# Patient Record
Sex: Male | Born: 1985 | Race: White | Hispanic: No | Marital: Married | Smoking: Former smoker
Health system: Southern US, Community
[De-identification: ages and names within clinical notes are randomized; demographics above are authoritative.]

## PROBLEM LIST (undated history)

## (undated) DIAGNOSIS — S2239XA Fracture of one rib, unspecified side, initial encounter for closed fracture: Secondary | ICD-10-CM

## (undated) DIAGNOSIS — S22000A Wedge compression fracture of unspecified thoracic vertebra, initial encounter for closed fracture: Secondary | ICD-10-CM

## (undated) DIAGNOSIS — W14XXXA Fall from tree, initial encounter: Secondary | ICD-10-CM

## (undated) DIAGNOSIS — S7292XA Unspecified fracture of left femur, initial encounter for closed fracture: Secondary | ICD-10-CM

## (undated) DIAGNOSIS — I10 Essential (primary) hypertension: Secondary | ICD-10-CM

## (undated) HISTORY — PX: INGUINAL HERNIA REPAIR: SUR1180

## (undated) HISTORY — PX: TYMPANOSTOMY TUBE PLACEMENT: SHX32

## (undated) HISTORY — PX: HERNIA REPAIR: SHX51

## (undated) HISTORY — PX: FRACTURE SURGERY: SHX138

---

## 2007-01-08 DIAGNOSIS — S22000A Wedge compression fracture of unspecified thoracic vertebra, initial encounter for closed fracture: Secondary | ICD-10-CM

## 2007-01-08 DIAGNOSIS — S2249XA Multiple fractures of ribs, unspecified side, initial encounter for closed fracture: Secondary | ICD-10-CM

## 2007-01-08 HISTORY — DX: Wedge compression fracture of unspecified thoracic vertebra, initial encounter for closed fracture: S22.000A

## 2007-01-08 HISTORY — DX: Multiple fractures of ribs, unspecified side, initial encounter for closed fracture: S22.49XA

## 2017-02-27 DIAGNOSIS — R030 Elevated blood-pressure reading, without diagnosis of hypertension: Secondary | ICD-10-CM | POA: Diagnosis not present

## 2017-02-27 DIAGNOSIS — L84 Corns and callosities: Secondary | ICD-10-CM | POA: Diagnosis not present

## 2017-09-12 ENCOUNTER — Emergency Department (HOSPITAL_COMMUNITY): Payer: Medicaid Other

## 2017-09-12 ENCOUNTER — Encounter (HOSPITAL_COMMUNITY): Payer: Self-pay | Admitting: Emergency Medicine

## 2017-09-12 ENCOUNTER — Other Ambulatory Visit: Payer: Self-pay

## 2017-09-12 ENCOUNTER — Encounter (HOSPITAL_COMMUNITY)
Admission: EM | Disposition: A | Discharge status: Home / Self Care | Payer: Self-pay | Source: Home / Self Care | Attending: Vascular Surgery

## 2017-09-12 ENCOUNTER — Inpatient Hospital Stay (HOSPITAL_COMMUNITY)
Admission: EM | Admit: 2017-09-12 | Discharge: 2017-09-15 | DRG: 907 | Disposition: A | Discharge status: Home / Self Care | Payer: Medicaid Other | Attending: Vascular Surgery | Admitting: Vascular Surgery

## 2017-09-12 ENCOUNTER — Emergency Department (HOSPITAL_COMMUNITY): Payer: Medicaid Other | Admitting: Certified Registered Nurse Anesthetist

## 2017-09-12 ENCOUNTER — Inpatient Hospital Stay (HOSPITAL_COMMUNITY): Payer: Medicaid Other

## 2017-09-12 DIAGNOSIS — Y9289 Other specified places as the place of occurrence of the external cause: Secondary | ICD-10-CM

## 2017-09-12 DIAGNOSIS — F17290 Nicotine dependence, other tobacco product, uncomplicated: Secondary | ICD-10-CM | POA: Diagnosis present

## 2017-09-12 DIAGNOSIS — S728X2A Other fracture of left femur, initial encounter for closed fracture: Secondary | ICD-10-CM | POA: Diagnosis not present

## 2017-09-12 DIAGNOSIS — S72351A Displaced comminuted fracture of shaft of right femur, initial encounter for closed fracture: Secondary | ICD-10-CM | POA: Diagnosis not present

## 2017-09-12 DIAGNOSIS — S3991XA Unspecified injury of abdomen, initial encounter: Secondary | ICD-10-CM | POA: Diagnosis not present

## 2017-09-12 DIAGNOSIS — S199XXA Unspecified injury of neck, initial encounter: Secondary | ICD-10-CM | POA: Diagnosis not present

## 2017-09-12 DIAGNOSIS — I998 Other disorder of circulatory system: Secondary | ICD-10-CM | POA: Diagnosis present

## 2017-09-12 DIAGNOSIS — S72352A Displaced comminuted fracture of shaft of left femur, initial encounter for closed fracture: Secondary | ICD-10-CM | POA: Diagnosis present

## 2017-09-12 DIAGNOSIS — Y93H2 Activity, gardening and landscaping: Secondary | ICD-10-CM | POA: Diagnosis not present

## 2017-09-12 DIAGNOSIS — S7292XB Unspecified fracture of left femur, initial encounter for open fracture type I or II: Secondary | ICD-10-CM | POA: Diagnosis not present

## 2017-09-12 DIAGNOSIS — R52 Pain, unspecified: Secondary | ICD-10-CM

## 2017-09-12 DIAGNOSIS — Z888 Allergy status to other drugs, medicaments and biological substances status: Secondary | ICD-10-CM | POA: Diagnosis not present

## 2017-09-12 DIAGNOSIS — W14XXXA Fall from tree, initial encounter: Secondary | ICD-10-CM | POA: Diagnosis not present

## 2017-09-12 DIAGNOSIS — R102 Pelvic and perineal pain: Secondary | ICD-10-CM | POA: Diagnosis not present

## 2017-09-12 DIAGNOSIS — S0990XA Unspecified injury of head, initial encounter: Secondary | ICD-10-CM | POA: Diagnosis not present

## 2017-09-12 DIAGNOSIS — S75022A Major laceration of femoral artery, left leg, initial encounter: Secondary | ICD-10-CM | POA: Diagnosis present

## 2017-09-12 DIAGNOSIS — E8889 Other specified metabolic disorders: Secondary | ICD-10-CM | POA: Diagnosis present

## 2017-09-12 DIAGNOSIS — S72402A Unspecified fracture of lower end of left femur, initial encounter for closed fracture: Secondary | ICD-10-CM | POA: Diagnosis not present

## 2017-09-12 DIAGNOSIS — T148XXA Other injury of unspecified body region, initial encounter: Secondary | ICD-10-CM

## 2017-09-12 DIAGNOSIS — R0902 Hypoxemia: Secondary | ICD-10-CM | POA: Diagnosis not present

## 2017-09-12 DIAGNOSIS — S22070A Wedge compression fracture of T9-T10 vertebra, initial encounter for closed fracture: Secondary | ICD-10-CM | POA: Diagnosis present

## 2017-09-12 DIAGNOSIS — D62 Acute posthemorrhagic anemia: Secondary | ICD-10-CM | POA: Diagnosis not present

## 2017-09-12 DIAGNOSIS — S72142A Displaced intertrochanteric fracture of left femur, initial encounter for closed fracture: Secondary | ICD-10-CM | POA: Diagnosis not present

## 2017-09-12 DIAGNOSIS — S7292XA Unspecified fracture of left femur, initial encounter for closed fracture: Secondary | ICD-10-CM | POA: Diagnosis not present

## 2017-09-12 DIAGNOSIS — S75092A Other specified injury of femoral artery, left leg, initial encounter: Secondary | ICD-10-CM | POA: Diagnosis not present

## 2017-09-12 DIAGNOSIS — I772 Rupture of artery: Secondary | ICD-10-CM | POA: Diagnosis present

## 2017-09-12 DIAGNOSIS — S7290XA Unspecified fracture of unspecified femur, initial encounter for closed fracture: Secondary | ICD-10-CM | POA: Diagnosis not present

## 2017-09-12 DIAGNOSIS — W19XXXA Unspecified fall, initial encounter: Secondary | ICD-10-CM | POA: Diagnosis not present

## 2017-09-12 DIAGNOSIS — R079 Chest pain, unspecified: Secondary | ICD-10-CM | POA: Diagnosis not present

## 2017-09-12 DIAGNOSIS — S72302A Unspecified fracture of shaft of left femur, initial encounter for closed fracture: Secondary | ICD-10-CM | POA: Diagnosis not present

## 2017-09-12 DIAGNOSIS — M79652 Pain in left thigh: Secondary | ICD-10-CM | POA: Diagnosis present

## 2017-09-12 DIAGNOSIS — S75002A Unspecified injury of femoral artery, left leg, initial encounter: Secondary | ICD-10-CM | POA: Diagnosis not present

## 2017-09-12 DIAGNOSIS — S3993XA Unspecified injury of pelvis, initial encounter: Secondary | ICD-10-CM | POA: Diagnosis not present

## 2017-09-12 DIAGNOSIS — R937 Abnormal findings on diagnostic imaging of other parts of musculoskeletal system: Secondary | ICD-10-CM | POA: Diagnosis not present

## 2017-09-12 DIAGNOSIS — R269 Unspecified abnormalities of gait and mobility: Secondary | ICD-10-CM | POA: Diagnosis not present

## 2017-09-12 DIAGNOSIS — S92342A Displaced fracture of fourth metatarsal bone, left foot, initial encounter for closed fracture: Secondary | ICD-10-CM | POA: Diagnosis not present

## 2017-09-12 DIAGNOSIS — S299XXA Unspecified injury of thorax, initial encounter: Secondary | ICD-10-CM | POA: Diagnosis not present

## 2017-09-12 DIAGNOSIS — I10 Essential (primary) hypertension: Secondary | ICD-10-CM | POA: Diagnosis not present

## 2017-09-12 HISTORY — DX: Fall from tree, initial encounter: W14.XXXA

## 2017-09-12 HISTORY — PX: FEMORAL ARTERY EXPLORATION: SHX5160

## 2017-09-12 HISTORY — PX: ENDARTERECTOMY FEMORAL: SHX5804

## 2017-09-12 HISTORY — DX: Fracture of one rib, unspecified side, initial encounter for closed fracture: S22.39XA

## 2017-09-12 HISTORY — DX: Unspecified fracture of left femur, initial encounter for closed fracture: S72.92XA

## 2017-09-12 HISTORY — PX: IM NAILING FEMORAL SHAFT RETROGRADE: SUR732

## 2017-09-12 HISTORY — PX: FEMUR IM NAIL: SHX1597

## 2017-09-12 HISTORY — DX: Wedge compression fracture of unspecified thoracic vertebra, initial encounter for closed fracture: S22.000A

## 2017-09-12 HISTORY — DX: Essential (primary) hypertension: I10

## 2017-09-12 LAB — COMPREHENSIVE METABOLIC PANEL
ALT: 48 U/L — ABNORMAL HIGH (ref 0–44)
AST: 50 U/L — ABNORMAL HIGH (ref 15–41)
Albumin: 4.4 g/dL (ref 3.5–5.0)
Alkaline Phosphatase: 53 U/L (ref 38–126)
Anion gap: 10 (ref 5–15)
BUN: 15 mg/dL (ref 6–20)
CO2: 24 mmol/L (ref 22–32)
Calcium: 9.1 mg/dL (ref 8.9–10.3)
Chloride: 108 mmol/L (ref 98–111)
Creatinine, Ser: 1.12 mg/dL (ref 0.61–1.24)
Glucose, Bld: 128 mg/dL — ABNORMAL HIGH (ref 70–99)
POTASSIUM: 3.5 mmol/L (ref 3.5–5.1)
SODIUM: 142 mmol/L (ref 135–145)
Total Bilirubin: 1.3 mg/dL — ABNORMAL HIGH (ref 0.3–1.2)
Total Protein: 7 g/dL (ref 6.5–8.1)

## 2017-09-12 LAB — PREPARE RBC (CROSSMATCH)

## 2017-09-12 LAB — POCT I-STAT 7, (LYTES, BLD GAS, ICA,H+H)
Acid-base deficit: 1 mmol/L (ref 0.0–2.0)
BICARBONATE: 24.4 mmol/L (ref 20.0–28.0)
Calcium, Ion: 1.12 mmol/L — ABNORMAL LOW (ref 1.15–1.40)
HCT: 34 % — ABNORMAL LOW (ref 39.0–52.0)
HEMOGLOBIN: 11.6 g/dL — AB (ref 13.0–17.0)
O2 Saturation: 100 %
PCO2 ART: 44.2 mmHg (ref 32.0–48.0)
PH ART: 7.348 — AB (ref 7.350–7.450)
PO2 ART: 234 mmHg — AB (ref 83.0–108.0)
Potassium: 3.4 mmol/L — ABNORMAL LOW (ref 3.5–5.1)
Sodium: 139 mmol/L (ref 135–145)
TCO2: 26 mmol/L (ref 22–32)

## 2017-09-12 LAB — I-STAT CHEM 8, ED
BUN: 19 mg/dL (ref 6–20)
CREATININE: 1 mg/dL (ref 0.61–1.24)
Calcium, Ion: 1.11 mmol/L — ABNORMAL LOW (ref 1.15–1.40)
Chloride: 106 mmol/L (ref 98–111)
GLUCOSE: 128 mg/dL — AB (ref 70–99)
HEMATOCRIT: 43 % (ref 39.0–52.0)
Hemoglobin: 14.6 g/dL (ref 13.0–17.0)
POTASSIUM: 3.3 mmol/L — AB (ref 3.5–5.1)
Sodium: 140 mmol/L (ref 135–145)
TCO2: 24 mmol/L (ref 22–32)

## 2017-09-12 LAB — CBC
HEMATOCRIT: 43.1 % (ref 39.0–52.0)
HEMOGLOBIN: 14.5 g/dL (ref 13.0–17.0)
MCH: 30.8 pg (ref 26.0–34.0)
MCHC: 33.6 g/dL (ref 30.0–36.0)
MCV: 91.5 fL (ref 78.0–100.0)
Platelets: 225 10*3/uL (ref 150–400)
RBC: 4.71 MIL/uL (ref 4.22–5.81)
RDW: 12.1 % (ref 11.5–15.5)
WBC: 14.5 10*3/uL — AB (ref 4.0–10.5)

## 2017-09-12 LAB — ETHANOL: Alcohol, Ethyl (B): <10 mg/dL (ref <10)

## 2017-09-12 LAB — I-STAT CG4 LACTIC ACID, ED: Lactic Acid, Venous: 1.48 mmol/L (ref 0.5–1.9)

## 2017-09-12 LAB — PROTIME-INR
INR: 0.99
PROTHROMBIN TIME: 13 s (ref 11.4–15.2)

## 2017-09-12 LAB — SAMPLE TO BLOOD BANK

## 2017-09-12 LAB — ABO/RH: ABO/RH(D): O POS

## 2017-09-12 SURGERY — INSERTION, INTRAMEDULLARY ROD, FEMUR, RETROGRADE
Anesthesia: General | Site: Leg Upper | Laterality: Left

## 2017-09-12 MED ORDER — ROCURONIUM BROMIDE 50 MG/5ML IV SOSY
PREFILLED_SYRINGE | INTRAVENOUS | Status: AC
  Filled 2017-09-12: qty 5

## 2017-09-12 MED ORDER — FENTANYL CITRATE (PF) 100 MCG/2ML IJ SOLN
INTRAMUSCULAR | Status: AC
  Filled 2017-09-12: qty 2

## 2017-09-12 MED ORDER — FENTANYL CITRATE (PF) 250 MCG/5ML IJ SOLN
INTRAMUSCULAR | Status: AC
  Filled 2017-09-12: qty 5

## 2017-09-12 MED ORDER — METOPROLOL TARTRATE 5 MG/5ML IV SOLN: 2.0000 mg | INTRAVENOUS | Status: DC | PRN

## 2017-09-12 MED ORDER — PAPAVERINE HCL 30 MG/ML IJ SOLN
INTRAMUSCULAR | Status: DC | PRN
  Administered 2017-09-12: 60 mg via INTRAVENOUS

## 2017-09-12 MED ORDER — BISACODYL 10 MG RE SUPP: 10.0000 mg | Freq: Every day | RECTAL | Status: DC | PRN

## 2017-09-12 MED ORDER — PROTAMINE SULFATE 10 MG/ML IV SOLN
INTRAVENOUS | Status: DC | PRN
  Administered 2017-09-12: 10 mg via INTRAVENOUS
  Administered 2017-09-12: 40 mg via INTRAVENOUS
  Administered 2017-09-12: 10 mg via INTRAVENOUS

## 2017-09-12 MED ORDER — ENOXAPARIN SODIUM 40 MG/0.4ML ~~LOC~~ SOLN
40.0000 mg | SUBCUTANEOUS | Status: DC
  Administered 2017-09-13 – 2017-09-15 (×3): 40 mg via SUBCUTANEOUS
  Filled 2017-09-12 (×3): qty 0.4

## 2017-09-12 MED ORDER — CEFAZOLIN SODIUM-DEXTROSE 2-3 GM-%(50ML) IV SOLR
INTRAVENOUS | Status: DC | PRN
  Administered 2017-09-12: 2 g via INTRAVENOUS

## 2017-09-12 MED ORDER — GUAIFENESIN-DM 100-10 MG/5ML PO SYRP: 15.0000 mL | ORAL_SOLUTION | ORAL | Status: DC | PRN

## 2017-09-12 MED ORDER — SODIUM CHLORIDE 0.9 % IV SOLN
INTRAVENOUS | Status: DC | PRN
  Administered 2017-09-12: 14:00:00 via INTRAVENOUS

## 2017-09-12 MED ORDER — HYDRALAZINE HCL 20 MG/ML IJ SOLN: 5.0000 mg | INTRAMUSCULAR | Status: DC | PRN

## 2017-09-12 MED ORDER — CEFAZOLIN SODIUM 1 G IJ SOLR
INTRAMUSCULAR | Status: AC
  Filled 2017-09-12: qty 20

## 2017-09-12 MED ORDER — PHENYLEPHRINE 40 MCG/ML (10ML) SYRINGE FOR IV PUSH (FOR BLOOD PRESSURE SUPPORT)
PREFILLED_SYRINGE | INTRAVENOUS | Status: AC
  Filled 2017-09-12: qty 10

## 2017-09-12 MED ORDER — IOPAMIDOL (ISOVUE-300) INJECTION 61%
INTRAVENOUS | Status: AC
  Filled 2017-09-12: qty 100

## 2017-09-12 MED ORDER — OXYCODONE HCL 5 MG PO TABS
5.0000 mg | ORAL_TABLET | ORAL | Status: DC | PRN
  Administered 2017-09-12 – 2017-09-13 (×2): 10 mg via ORAL
  Administered 2017-09-13 (×4): 5 mg via ORAL
  Administered 2017-09-13 – 2017-09-15 (×8): 10 mg via ORAL
  Filled 2017-09-12: qty 1
  Filled 2017-09-12 (×5): qty 2
  Filled 2017-09-12: qty 1
  Filled 2017-09-12 (×3): qty 2
  Filled 2017-09-12: qty 1
  Filled 2017-09-12 (×3): qty 2

## 2017-09-12 MED ORDER — DEXAMETHASONE SODIUM PHOSPHATE 10 MG/ML IJ SOLN
INTRAMUSCULAR | Status: DC | PRN
  Administered 2017-09-12: 10 mg via INTRAVENOUS

## 2017-09-12 MED ORDER — LIDOCAINE HCL (CARDIAC) PF 100 MG/5ML IV SOSY
PREFILLED_SYRINGE | INTRAVENOUS | Status: DC | PRN
  Administered 2017-09-12: 100 mg via INTRAVENOUS

## 2017-09-12 MED ORDER — LIDOCAINE 2% (20 MG/ML) 5 ML SYRINGE
INTRAMUSCULAR | Status: AC
  Filled 2017-09-12: qty 5

## 2017-09-12 MED ORDER — IOPAMIDOL (ISOVUE-300) INJECTION 61%: 100.0000 mL | Freq: Once | INTRAVENOUS | Status: DC

## 2017-09-12 MED ORDER — HYDROMORPHONE HCL 1 MG/ML IJ SOLN
1.0000 mg | Freq: Once | INTRAMUSCULAR | Status: AC
  Administered 2017-09-12: 1 mg via INTRAVENOUS

## 2017-09-12 MED ORDER — ACETAMINOPHEN 500 MG PO TABS
500.0000 mg | ORAL_TABLET | Freq: Three times a day (TID) | ORAL | Status: DC
  Administered 2017-09-12 – 2017-09-15 (×9): 500 mg via ORAL
  Filled 2017-09-12 (×10): qty 1

## 2017-09-12 MED ORDER — HYDROMORPHONE HCL 1 MG/ML IJ SOLN
INTRAMUSCULAR | Status: AC
  Administered 2017-09-12: 0.5 mg via INTRAVENOUS
  Filled 2017-09-12: qty 1

## 2017-09-12 MED ORDER — FENTANYL CITRATE (PF) 100 MCG/2ML IJ SOLN
INTRAMUSCULAR | Status: DC | PRN
  Administered 2017-09-12: 50 ug via INTRAVENOUS
  Administered 2017-09-12: 100 ug via INTRAVENOUS
  Administered 2017-09-12 (×6): 50 ug via INTRAVENOUS
  Administered 2017-09-12: 100 ug via INTRAVENOUS
  Administered 2017-09-12 (×2): 50 ug via INTRAVENOUS

## 2017-09-12 MED ORDER — SODIUM CHLORIDE 0.9 % IV SOLN: 500.0000 mL | Freq: Once | INTRAVENOUS | Status: DC | PRN

## 2017-09-12 MED ORDER — SODIUM CHLORIDE 0.9 % IV SOLN
INTRAVENOUS | Status: DC
  Administered 2017-09-12 – 2017-09-15 (×6): via INTRAVENOUS

## 2017-09-12 MED ORDER — FENTANYL CITRATE (PF) 100 MCG/2ML IJ SOLN
100.0000 ug | Freq: Once | INTRAMUSCULAR | Status: AC
  Administered 2017-09-12: 100 ug via INTRAVENOUS

## 2017-09-12 MED ORDER — MIDAZOLAM HCL 5 MG/5ML IJ SOLN
INTRAMUSCULAR | Status: DC | PRN
  Administered 2017-09-12: 2 mg via INTRAVENOUS

## 2017-09-12 MED ORDER — CEFAZOLIN SODIUM-DEXTROSE 2-4 GM/100ML-% IV SOLN
2.0000 g | Freq: Three times a day (TID) | INTRAVENOUS | Status: AC
  Administered 2017-09-12 – 2017-09-13 (×2): 2 g via INTRAVENOUS
  Filled 2017-09-12 (×2): qty 100

## 2017-09-12 MED ORDER — DOCUSATE SODIUM 100 MG PO CAPS
100.0000 mg | ORAL_CAPSULE | Freq: Every day | ORAL | Status: DC
  Administered 2017-09-13 – 2017-09-15 (×3): 100 mg via ORAL
  Filled 2017-09-12 (×3): qty 1

## 2017-09-12 MED ORDER — PROTAMINE SULFATE 10 MG/ML IV SOLN
INTRAVENOUS | Status: AC
  Filled 2017-09-12: qty 10

## 2017-09-12 MED ORDER — METHOCARBAMOL 750 MG PO TABS
750.0000 mg | ORAL_TABLET | Freq: Three times a day (TID) | ORAL | Status: DC
  Administered 2017-09-12 – 2017-09-15 (×9): 750 mg via ORAL
  Filled 2017-09-12 (×10): qty 1

## 2017-09-12 MED ORDER — PHENOL 1.4 % MT LIQD: 1.0000 | OROMUCOSAL | Status: DC | PRN

## 2017-09-12 MED ORDER — ETOMIDATE 2 MG/ML IV SOLN
INTRAVENOUS | Status: AC
  Filled 2017-09-12: qty 10

## 2017-09-12 MED ORDER — ARTIFICIAL TEARS OPHTHALMIC OINT
TOPICAL_OINTMENT | OPHTHALMIC | Status: AC
  Filled 2017-09-12: qty 3.5

## 2017-09-12 MED ORDER — PHENYLEPHRINE HCL 10 MG/ML IJ SOLN
INTRAMUSCULAR | Status: DC | PRN
  Administered 2017-09-12: 80 ug via INTRAVENOUS

## 2017-09-12 MED ORDER — MIDAZOLAM HCL 2 MG/2ML IJ SOLN
INTRAMUSCULAR | Status: AC
  Filled 2017-09-12: qty 2

## 2017-09-12 MED ORDER — METHOCARBAMOL 500 MG PO TABS
ORAL_TABLET | ORAL | Status: AC
  Filled 2017-09-12: qty 1

## 2017-09-12 MED ORDER — MAGNESIUM SULFATE 2 GM/50ML IV SOLN: 2.0000 g | Freq: Every day | INTRAVENOUS | Status: DC | PRN

## 2017-09-12 MED ORDER — ENOXAPARIN SODIUM 30 MG/0.3ML ~~LOC~~ SOLN: 30.0000 mg | SUBCUTANEOUS | Status: DC

## 2017-09-12 MED ORDER — ONDANSETRON HCL 4 MG/2ML IJ SOLN
INTRAMUSCULAR | Status: AC
  Filled 2017-09-12: qty 2

## 2017-09-12 MED ORDER — OXYCODONE-ACETAMINOPHEN 7.5-325 MG PO TABS: 1.0000 | ORAL_TABLET | Freq: Three times a day (TID) | ORAL | Status: DC | PRN

## 2017-09-12 MED ORDER — SUGAMMADEX SODIUM 200 MG/2ML IV SOLN
INTRAVENOUS | Status: DC | PRN
  Administered 2017-09-12: 200 mg via INTRAVENOUS

## 2017-09-12 MED ORDER — SUCCINYLCHOLINE CHLORIDE 200 MG/10ML IV SOSY
PREFILLED_SYRINGE | INTRAVENOUS | Status: DC | PRN
  Administered 2017-09-12: 120 mg via INTRAVENOUS

## 2017-09-12 MED ORDER — LABETALOL HCL 5 MG/ML IV SOLN: 10.0000 mg | INTRAVENOUS | Status: DC | PRN

## 2017-09-12 MED ORDER — HEPARIN SODIUM (PORCINE) 1000 UNIT/ML IJ SOLN
INTRAMUSCULAR | Status: DC | PRN
  Administered 2017-09-12: 8000 [IU] via INTRAVENOUS

## 2017-09-12 MED ORDER — HYDROMORPHONE HCL 1 MG/ML IJ SOLN
INTRAMUSCULAR | Status: AC
  Filled 2017-09-12: qty 1

## 2017-09-12 MED ORDER — OXYCODONE-ACETAMINOPHEN 5-325 MG PO TABS: 1.0000 | ORAL_TABLET | ORAL | Status: DC | PRN

## 2017-09-12 MED ORDER — PROPOFOL 10 MG/ML IV BOLUS
INTRAVENOUS | Status: DC | PRN
  Administered 2017-09-12: 200 mg via INTRAVENOUS
  Administered 2017-09-12: 50 mg via INTRAVENOUS

## 2017-09-12 MED ORDER — IOPAMIDOL (ISOVUE-370) INJECTION 76%
100.0000 mL | Freq: Once | INTRAVENOUS | Status: AC | PRN
  Administered 2017-09-12: 100 mL via INTRAVENOUS

## 2017-09-12 MED ORDER — ONDANSETRON HCL 4 MG/2ML IJ SOLN
INTRAMUSCULAR | Status: DC | PRN
  Administered 2017-09-12: 4 mg via INTRAVENOUS

## 2017-09-12 MED ORDER — MORPHINE SULFATE (PF) 2 MG/ML IV SOLN: 2.0000 mg | INTRAVENOUS | Status: DC | PRN

## 2017-09-12 MED ORDER — DEXAMETHASONE SODIUM PHOSPHATE 10 MG/ML IJ SOLN
INTRAMUSCULAR | Status: AC
  Filled 2017-09-12: qty 1

## 2017-09-12 MED ORDER — ONDANSETRON HCL 4 MG/2ML IJ SOLN: 4.0000 mg | Freq: Four times a day (QID) | INTRAMUSCULAR | Status: DC | PRN

## 2017-09-12 MED ORDER — ACETAMINOPHEN 325 MG PO TABS: 325.0000 mg | ORAL_TABLET | ORAL | Status: DC | PRN

## 2017-09-12 MED ORDER — ALUM & MAG HYDROXIDE-SIMETH 200-200-20 MG/5ML PO SUSP: 15.0000 mL | ORAL | Status: DC | PRN

## 2017-09-12 MED ORDER — ROCURONIUM BROMIDE 10 MG/ML (PF) SYRINGE
PREFILLED_SYRINGE | INTRAVENOUS | Status: DC | PRN
  Administered 2017-09-12: 25 mg via INTRAVENOUS
  Administered 2017-09-12 (×4): 50 mg via INTRAVENOUS
  Administered 2017-09-12: 40 mg via INTRAVENOUS

## 2017-09-12 MED ORDER — 0.9 % SODIUM CHLORIDE (POUR BTL) OPTIME
TOPICAL | Status: DC | PRN
  Administered 2017-09-12 (×3): 1000 mL

## 2017-09-12 MED ORDER — LABETALOL HCL 5 MG/ML IV SOLN
INTRAVENOUS | Status: AC
  Filled 2017-09-12: qty 4

## 2017-09-12 MED ORDER — HYDROMORPHONE HCL 1 MG/ML IJ SOLN
0.2500 mg | INTRAMUSCULAR | Status: DC | PRN
  Administered 2017-09-12 (×2): 0.5 mg via INTRAVENOUS

## 2017-09-12 MED ORDER — ACETAMINOPHEN 325 MG RE SUPP: 325.0000 mg | RECTAL | Status: DC | PRN

## 2017-09-12 MED ORDER — SUCCINYLCHOLINE CHLORIDE 200 MG/10ML IV SOSY
PREFILLED_SYRINGE | INTRAVENOUS | Status: AC
  Filled 2017-09-12: qty 10

## 2017-09-12 MED ORDER — PROPOFOL 10 MG/ML IV BOLUS
INTRAVENOUS | Status: AC
  Filled 2017-09-12: qty 20

## 2017-09-12 MED ORDER — OXYCODONE HCL 5 MG PO TABS
ORAL_TABLET | ORAL | Status: AC
  Filled 2017-09-12: qty 1

## 2017-09-12 MED ORDER — PROMETHAZINE HCL 25 MG/ML IJ SOLN: 6.2500 mg | INTRAMUSCULAR | Status: DC | PRN

## 2017-09-12 MED ORDER — SODIUM CHLORIDE 0.9 % IV SOLN
INTRAVENOUS | Status: DC | PRN
  Administered 2017-09-12: 500 mL

## 2017-09-12 MED ORDER — POLYETHYLENE GLYCOL 3350 17 G PO PACK: 17.0000 g | PACK | Freq: Every day | ORAL | Status: DC | PRN

## 2017-09-12 MED ORDER — CEFAZOLIN SODIUM-DEXTROSE 2-4 GM/100ML-% IV SOLN: 2.0000 g | Freq: Three times a day (TID) | INTRAVENOUS | Status: DC

## 2017-09-12 MED ORDER — POTASSIUM CHLORIDE CRYS ER 20 MEQ PO TBCR: 20.0000 meq | EXTENDED_RELEASE_TABLET | Freq: Every day | ORAL | Status: DC | PRN

## 2017-09-12 MED ORDER — LACTATED RINGERS IV SOLN
INTRAVENOUS | Status: DC | PRN
  Administered 2017-09-12 (×3): via INTRAVENOUS

## 2017-09-12 MED ORDER — PAPAVERINE HCL 30 MG/ML IJ SOLN
INTRAMUSCULAR | Status: AC
  Filled 2017-09-12: qty 2

## 2017-09-12 MED ORDER — SODIUM CHLORIDE 0.9% IV SOLUTION: Freq: Once | INTRAVENOUS | Status: DC

## 2017-09-12 MED ORDER — ROCURONIUM BROMIDE 50 MG/5ML IV SOSY
PREFILLED_SYRINGE | INTRAVENOUS | Status: AC
  Filled 2017-09-12: qty 15

## 2017-09-12 MED ORDER — LABETALOL HCL 5 MG/ML IV SOLN
INTRAVENOUS | Status: DC | PRN
  Administered 2017-09-12 (×2): 10 mg via INTRAVENOUS

## 2017-09-12 MED ORDER — SODIUM CHLORIDE 0.9 % IV SOLN
INTRAVENOUS | Status: AC
  Filled 2017-09-12: qty 1.2

## 2017-09-12 MED ORDER — PANTOPRAZOLE SODIUM 40 MG PO TBEC
40.0000 mg | DELAYED_RELEASE_TABLET | Freq: Every day | ORAL | Status: DC
  Administered 2017-09-12 – 2017-09-15 (×4): 40 mg via ORAL
  Filled 2017-09-12 (×4): qty 1

## 2017-09-12 SURGICAL SUPPLY — 95 items
BAG ISOLATION DRAPE 18X18 (DRAPES) ×6 IMPLANT
BANDAGE ACE 4X5 VEL STRL LF (GAUZE/BANDAGES/DRESSINGS) ×3 IMPLANT
BANDAGE ACE 6X5 VEL STRL LF (GAUZE/BANDAGES/DRESSINGS) ×3 IMPLANT
BANDAGE ESMARK 6X9 LF (GAUZE/BANDAGES/DRESSINGS) IMPLANT
BIT DRILL CALIBRATED 4.3MMX365 (DRILL) ×2 IMPLANT
BIT DRILL CROWE PNT TWST 4.5MM (DRILL) ×2 IMPLANT
BNDG COHESIVE 4X5 TAN STRL (GAUZE/BANDAGES/DRESSINGS) ×6 IMPLANT
BNDG ESMARK 6X9 LF (GAUZE/BANDAGES/DRESSINGS)
BNDG GAUZE ELAST 4 BULKY (GAUZE/BANDAGES/DRESSINGS) ×3 IMPLANT
BRUSH SCRUB SURG 4.25 DISP (MISCELLANEOUS) ×6 IMPLANT
CANISTER SUCT 3000ML PPV (MISCELLANEOUS) ×3 IMPLANT
CANNULA VESSEL 3MM 2 BLNT TIP (CANNULA) ×9 IMPLANT
CLIP VESOCCLUDE MED 24/CT (CLIP) ×3 IMPLANT
CLIP VESOCCLUDE SM WIDE 24/CT (CLIP) ×3 IMPLANT
COVER MAYO STAND STRL (DRAPES) ×3 IMPLANT
COVER SURGICAL LIGHT HANDLE (MISCELLANEOUS) ×6 IMPLANT
DERMABOND ADVANCED (GAUZE/BANDAGES/DRESSINGS) ×1
DERMABOND ADVANCED .7 DNX12 (GAUZE/BANDAGES/DRESSINGS) ×2 IMPLANT
DRAIN CHANNEL 19F RND (DRAIN) ×3 IMPLANT
DRAPE C-ARM 42X72 X-RAY (DRAPES) ×3 IMPLANT
DRAPE C-ARMOR (DRAPES) ×3 IMPLANT
DRAPE IMP U-DRAPE 54X76 (DRAPES) IMPLANT
DRAPE INCISE IOBAN 66X45 STRL (DRAPES) ×3 IMPLANT
DRAPE ISOLATION BAG 18X18 (DRAPES) ×3
DRAPE ORTHO SPLIT 77X108 STRL (DRAPES) ×4
DRAPE SURG ORHT 6 SPLT 77X108 (DRAPES) ×8 IMPLANT
DRAPE U-SHAPE 47X51 STRL (DRAPES) ×3 IMPLANT
DRILL CALIBRATED 4.3MMX365 (DRILL) ×3
DRILL CROWE POINT TWIST 4.5MM (DRILL) ×3
DRSG COVADERM 4X6 (GAUZE/BANDAGES/DRESSINGS) ×3 IMPLANT
DRSG MEPILEX BORDER 4X4 (GAUZE/BANDAGES/DRESSINGS) ×3 IMPLANT
DRSG MEPILEX BORDER 4X8 (GAUZE/BANDAGES/DRESSINGS) ×3 IMPLANT
ELECT REM PT RETURN 9FT ADLT (ELECTROSURGICAL) ×6
ELECTRODE REM PT RTRN 9FT ADLT (ELECTROSURGICAL) ×4 IMPLANT
EVACUATOR 1/8 PVC DRAIN (DRAIN) IMPLANT
EVACUATOR SILICONE 100CC (DRAIN) ×3 IMPLANT
GAUZE SPONGE 4X4 12PLY STRL (GAUZE/BANDAGES/DRESSINGS) ×6 IMPLANT
GAUZE SPONGE 4X4 12PLY STRL LF (GAUZE/BANDAGES/DRESSINGS) ×3 IMPLANT
GAUZE XEROFORM 1X8 LF (GAUZE/BANDAGES/DRESSINGS) ×3 IMPLANT
GLOVE BIO SURGEON STRL SZ7.5 (GLOVE) ×6 IMPLANT
GLOVE BIO SURGEON STRL SZ8 (GLOVE) ×3 IMPLANT
GLOVE BIOGEL PI IND STRL 7.5 (GLOVE) ×2 IMPLANT
GLOVE BIOGEL PI IND STRL 8 (GLOVE) ×4 IMPLANT
GLOVE BIOGEL PI INDICATOR 7.5 (GLOVE) ×1
GLOVE BIOGEL PI INDICATOR 8 (GLOVE) ×2
GOWN STRL REUS W/ TWL LRG LVL3 (GOWN DISPOSABLE) ×10 IMPLANT
GOWN STRL REUS W/ TWL XL LVL3 (GOWN DISPOSABLE) ×2 IMPLANT
GOWN STRL REUS W/TWL LRG LVL3 (GOWN DISPOSABLE) ×5
GOWN STRL REUS W/TWL XL LVL3 (GOWN DISPOSABLE) ×1
GUIDEPIN 3.2X17.5 THRD DISP (PIN) ×3 IMPLANT
GUIDEWIRE BEAD TIP (WIRE) ×3 IMPLANT
KIT BASIN OR (CUSTOM PROCEDURE TRAY) ×6 IMPLANT
KIT TURNOVER KIT B (KITS) ×6 IMPLANT
NAIL FEM RETRO 9X400 (Nail) ×3 IMPLANT
NEEDLE HYPO 21X1.5 SAFETY (NEEDLE) ×3 IMPLANT
NS IRRIG 1000ML POUR BTL (IV SOLUTION) ×9 IMPLANT
PACK PERIPHERAL VASCULAR (CUSTOM PROCEDURE TRAY) ×3 IMPLANT
PACK UNIVERSAL I (CUSTOM PROCEDURE TRAY) ×3 IMPLANT
PAD ARMBOARD 7.5X6 YLW CONV (MISCELLANEOUS) ×6 IMPLANT
SCREW CORT TI DBL LEAD 5X32 (Screw) ×3 IMPLANT
SCREW CORT TI DBL LEAD 5X34 (Screw) ×3 IMPLANT
SCREW CORT TI DBL LEAD 5X56 (Screw) ×3 IMPLANT
SCREW CORT TI DBL LEAD 5X60 (Screw) ×3 IMPLANT
SCREW CORT TI DBL LEAD 5X85 (Screw) ×3 IMPLANT
SPONGE LAP 18X18 X RAY DECT (DISPOSABLE) ×3 IMPLANT
SPONGE SURGIFOAM ABS GEL 100 (HEMOSTASIS) IMPLANT
STAPLER VISISTAT (STAPLE) IMPLANT
STAPLER VISISTAT 35W (STAPLE) ×3 IMPLANT
STOCKINETTE IMPERVIOUS LG (DRAPES) ×6 IMPLANT
SUT ETHILON 3 0 PS 1 (SUTURE) ×3 IMPLANT
SUT PROLENE 3 0 PS 2 (SUTURE) IMPLANT
SUT PROLENE 5 0 C 1 24 (SUTURE) ×3 IMPLANT
SUT PROLENE 6 0 BV (SUTURE) ×15 IMPLANT
SUT SILK 3 0 (SUTURE) ×1
SUT SILK 3-0 18XBRD TIE 12 (SUTURE) ×2 IMPLANT
SUT VIC AB 0 CT1 27 (SUTURE) ×1
SUT VIC AB 0 CT1 27XBRD ANBCTR (SUTURE) ×2 IMPLANT
SUT VIC AB 2-0 CT1 27 (SUTURE) ×2
SUT VIC AB 2-0 CT1 TAPERPNT 27 (SUTURE) ×4 IMPLANT
SUT VIC AB 2-0 CT3 27 (SUTURE) IMPLANT
SUT VIC AB 2-0 CTB1 (SUTURE) ×3 IMPLANT
SUT VIC AB 3-0 SH 27 (SUTURE) ×3
SUT VIC AB 3-0 SH 27X BRD (SUTURE) ×6 IMPLANT
SUT VIC AB 4-0 PS2 18 (SUTURE) ×3 IMPLANT
SUT VICRYL 4-0 PS2 18IN ABS (SUTURE) ×6 IMPLANT
SYRINGE 20CC LL (MISCELLANEOUS) ×3 IMPLANT
SYRINGE 3CC LL L/F (MISCELLANEOUS) ×3 IMPLANT
TOWEL GREEN STERILE (TOWEL DISPOSABLE) ×3 IMPLANT
TOWEL OR 17X24 6PK STRL BLUE (TOWEL DISPOSABLE) ×3 IMPLANT
TOWEL OR 17X26 10 PK STRL BLUE (TOWEL DISPOSABLE) ×6 IMPLANT
TRAY FOLEY MTR SLVR 16FR STAT (SET/KITS/TRAYS/PACK) ×3 IMPLANT
TUBE CONNECTING 12X1/4 (SUCTIONS) ×3 IMPLANT
UNDERPAD 30X30 (UNDERPADS AND DIAPERS) ×3 IMPLANT
WATER STERILE IRR 1000ML POUR (IV SOLUTION) ×3 IMPLANT
YANKAUER SUCT BULB TIP NO VENT (SUCTIONS) ×3 IMPLANT

## 2017-09-12 NOTE — ED Notes (Signed)
Nurse transported patient to CT  

## 2017-09-12 NOTE — Consult Note (Signed)
Patient name: Clinton Kelley MRN: [id number] DOB: 02/12/1985 Sex: male  REASON FOR CONSULT:   Ischemic left lower extremity  HPI:   Clinton Kelley is a pleasant 32 y.o. male who fell 30 feet from a tree today and fractured his left femur.  He was noted to have no Doppler flow in the left foot and a CT angiogram was obtained which showed an abrupt occlusion of the left superficial femoral artery.  Of note he did not hit his head or lose consciousness reportedly.  His neck is not yet been cleared.  He does not complain of neck pain.  Orthopedic surgery plans to fix his fracture and then I have explained that I will need to attempt to repair the left superficial femoral artery.  Patient's medical history according to the family is fairly unremarkable.  He is not a smoke cigarettes, although he does not vape.  Current Facility-Administered Medications  Medication Dose Route Frequency Provider Last Rate Last Dose  . 0.9 %  sodium chloride infusion (Manually program via Guardrails IV Fluids)   Intravenous Once Marcene Duos, MD      . fentaNYL (SUBLIMAZE) 100 MCG/2ML injection           . fentaNYL (SUBLIMAZE) 100 MCG/2ML injection           . HYDROmorphone (DILAUDID) 1 MG/ML injection           . [MAR Hold] iopamidol (ISOVUE-300) 61 % injection 100 mL  100 mL Intravenous Once Tilden Fossa, MD      . iopamidol (ISOVUE-300) 61 % injection             REVIEW OF SYSTEMS:  [X]  denotes positive finding, [ ]  denotes negative finding Vascular    Leg swelling    Cardiac    Chest pain or chest pressure:    Shortness of breath upon exertion:    Short of breath when lying flat:    Irregular heart rhythm:    Constitutional    Fever or chills:     PHYSICAL EXAM:   Vitals:   09/12/17 1215 09/12/17 1230 09/12/17 1235 09/12/17 1245  BP: (!) 144/79 (!) 167/94  (!) 157/97  Pulse: 83 89 84 (!) 101  Resp: 16 16 16 12   Temp:      TempSrc:      SpO2: 92% 93% 96% 98%  Weight:      Height:         GENERAL: The patient is a well-nourished male, in no acute distress. The vital signs are documented above. CARDIOVASCULAR: There is a regular rate and rhythm. PULMONARY: There is good air exchange bilaterally without wheezing or rales. VASCULAR: He has a palpable left femoral pulse.  I cannot get Doppler signals in the left foot.  The left foot appears ischemic.  DATA:   CT ANGIOGRAM: I have reviewed his CT angiogram which shows an abrupt occlusion of the left superficial femoral artery.  MEDICAL ISSUES:   ISCHEMIC LEFT LOWER EXTREMITY SECONDARY TO SUPERFICIAL FEMORAL ARTERY INJURY: This patient has an abrupt occlusion of his left superficial femoral artery from a fractured femur related to a 30 foot fall.  Orthopedic surgery plans to fix the fracture and then I have recommended urgent repair of the artery.  Given the potential for venous injury I may potentially have to take vein from the right leg.  I have reviewed the indications for the surgery and the potential complications with the patient and his family and  he is agreeable to proceed.  All his questions were answered.  We will proceed urgently.  [facility] [phone]

## 2017-09-12 NOTE — Transfer of Care (Signed)
Immediate Anesthesia Transfer of Care Note  Patient: Clinton Kelley  Procedure(s) Performed: INTRAMEDULLARY (IM) PHOENIX RETROGRADE FEMORAL NAILING (Left ) ENDARTERECTOMY FEMORAL WITH VEIN GRAFTING (Left )  Patient Location: PACU  Anesthesia Type:General  Level of Consciousness: awake, alert  and oriented  Airway & Oxygen Therapy: Patient Spontanous Breathing, [place] 2L  Post-op Assessment: Report given to RN and Post -op Vital signs reviewed and stable  Post vital signs: Reviewed and stable  Last Vitals:  Vitals Value Taken Time  BP 143/96 09/12/2017  5:25 PM  Temp    Pulse 90 09/12/2017  5:31 PM  Resp 15 09/12/2017  5:31 PM  SpO2 99 % 09/12/2017  5:31 PM  Vitals shown include unvalidated device data.  Last Pain:  Vitals:   09/12/17 1232  TempSrc:   PainSc: 6          Complications: No apparent anesthesia complications

## 2017-09-12 NOTE — ED Notes (Signed)
Spoke with ortho tech patient ordered bucks traction 10 pounds to left leg.

## 2017-09-12 NOTE — Progress Notes (Signed)
Orthopedic Tech Progress Note Patient Details:  FRANK NEER [birth year] [id number]  Musculoskeletal Traction Type of Traction: Bucks Skin Traction Traction Location: lle Traction Weight: 10 lbs   Post Interventions Patient Tolerated: Well Instructions Provided: Care of device As ordered by PA Bea Laura, Celie Desrochers 09/12/2017, 12:54 PM

## 2017-09-12 NOTE — Progress Notes (Signed)
No neck pain.  CT C-spine negative for fracture.  Cleared C-spine.  Marta Lamas. Gae Bon, MD, FACS [phone] Trauma Surgeon

## 2017-09-12 NOTE — Consult Note (Signed)
Reason for Consult:Fall from tree at 30 feet with left femur fracture Referring Physician: Handy  Clinton Kelley is an 32 y.o. male.  HPI: Patient fell 30 feet from a tree while at work, hitting a limb on the way down, no LOC,   Come plaining of lower back pain and left lower extremity pain where he has an obvious deformity.  Lack of pulse in left leg led to CTA of the leg showing a mid to distal left superficial femoral artery interruption, possible transection.  Orthopedic surgery and vascular surgery to take the patient to the OR>  We were asked to see the patient as he was being taken to the OR for ORIF and vascular exploration.  Past Medical History:  Diagnosis Date  . Hypertension     Past Surgical History:  Procedure Laterality Date  . BACK SURGERY      History reviewed. No pertinent family history.  Social History:  reports that he has never smoked. He does not have any smokeless tobacco history on file. He reports that he drinks alcohol. He reports that he does not use drugs.  Allergies:  Allergies  Allergen Reactions  . Benadryl [Diphenhydramine]     Medications: I have reviewed the patient's current medications.  Results for orders placed or performed during the hospital encounter of 09/12/17 (from the past 48 hour(s))  Comprehensive metabolic panel     Status: Abnormal   Collection Time: 09/12/17 11:26 AM  Result Value Ref Range   Sodium 142 135 - 145 mmol/L   Potassium 3.5 3.5 - 5.1 mmol/L   Chloride 108 98 - 111 mmol/L   CO2 24 22 - 32 mmol/L   Glucose, Bld 128 (H) 70 - 99 mg/dL   BUN 15 6 - 20 mg/dL   Creatinine, Ser 1.12 0.61 - 1.24 mg/dL   Calcium 9.1 8.9 - 10.3 mg/dL   Total Protein 7.0 6.5 - 8.1 g/dL   Albumin 4.4 3.5 - 5.0 g/dL   AST 50 (H) 15 - 41 U/L   ALT 48 (H) 0 - 44 U/L   Alkaline Phosphatase 53 38 - 126 U/L   Total Bilirubin 1.3 (H) 0.3 - 1.2 mg/dL   GFR calc non Af Amer >60 >60 mL/min   GFR calc Af Amer >60 >60 mL/min    Comment: (NOTE) The  eGFR has been calculated using the CKD EPI equation. This calculation has not been validated in all clinical situations. eGFR's persistently <60 mL/min signify possible Chronic Kidney Disease.    Anion gap 10 5 - 15    Comment: Performed at [facility], [address] [place], [place] [address]  CBC     Status: Abnormal   Collection Time: 09/12/17 11:26 AM  Result Value Ref Range   WBC 14.5 (H) 4.0 - 10.5 K/uL   RBC 4.71 4.22 - 5.81 MIL/uL   Hemoglobin 14.5 13.0 - 17.0 g/dL   HCT 43.1 39.0 - 52.0 %   MCV 91.5 78.0 - 100.0 fL   MCH 30.8 26.0 - 34.0 pg   MCHC 33.6 30.0 - 36.0 g/dL   RDW 12.1 11.5 - 15.5 %   Platelets 225 150 - 400 K/uL    Comment: Performed at [facility], [address] [place], [place] [address]  Ethanol     Status: None   Collection Time: 09/12/17 11:26 AM  Result Value Ref Range   Alcohol, Ethyl (B) <10 <10 mg/dL    Comment: (NOTE)   Lowest detectable limit for serum alcohol is 10 mg/dL. For medical purposes only. Performed at [facility], [place] [address], [place], [place] [address]   Protime-INR     Status: None   Collection Time: 09/12/17 11:26 AM  Result Value Ref Range   Prothrombin Time 13.0 11.4 - 15.2 seconds   INR 0.99     Comment: Performed at [facility] [address] [place], [place] [address]  Sample to Blood Bank     Status: None   Collection Time: 09/12/17 11:26 AM  Result Value Ref Range   Blood Bank Specimen SAMPLE AVAILABLE FOR TESTING    Sample Expiration      09/13/2017 Performed at [facility], [place] [address], [place], [place] [address]   Type and screen [place]     Status: None (Preliminary result)   Collection Time: 09/12/17 11:26 AM  Result Value Ref Range   ABO/RH(D) O POS    Antibody Screen PENDING    Sample Expiration      09/15/2017 Performed at [facility], [place] [address], [place], [place] [address]   Prepare RBC (crossmatch)     Status: None    Collection Time: 09/12/17 11:26 AM  Result Value Ref Range   Order Confirmation      ORDER PROCESSED BY BLOOD BANK Performed at [facility], [place] [address], [place], [place] [address]   I-Stat Chem 8, ED     Status: Abnormal   Collection Time: 09/12/17 11:33 AM  Result Value Ref Range   Sodium 140 135 - 145 mmol/L   Potassium 3.3 (L) 3.5 - 5.1 mmol/L   Chloride 106 98 - 111 mmol/L   BUN 19 6 - 20 mg/dL   Creatinine, Ser 1.00 0.61 - 1.24 mg/dL   Glucose, Bld 128 (H) 70 - 99 mg/dL   Calcium, Ion 1.11 (L) 1.15 - 1.40 mmol/L   TCO2 24 22 - 32 mmol/L   Hemoglobin 14.6 13.0 - 17.0 g/dL   HCT 43.0 39.0 - 52.0 %  I-Stat CG4 Lactic Acid, ED     Status: None   Collection Time: 09/12/17 11:34 AM  Result Value Ref Range   Lactic Acid, Venous 1.48 0.5 - 1.9 mmol/L    Ct Head Wo Contrast  Result Date: 09/12/2017 CLINICAL DATA:  Patient fell from 50 feet. EXAM: CT HEAD WITHOUT CONTRAST CT CERVICAL SPINE WITHOUT CONTRAST TECHNIQUE: Multidetector CT imaging of the head and cervical spine was performed following the standard protocol without intravenous contrast. Multiplanar CT image reconstructions of the cervical spine were also generated. COMPARISON:  None. FINDINGS: CT HEAD FINDINGS Brain: No evidence of acute infarction, hemorrhage, hydrocephalus, extra-axial collection or mass lesion/mass effect. Vascular: No hyperdense vessel or unexpected calcification. Skull: Normal. Negative for fracture or focal lesion. Sinuses/Orbits: Globes and orbits are unremarkable. Sinuses and mastoid air cells are clear. Other: None. CT CERVICAL SPINE FINDINGS Alignment: Normal. Skull base and vertebrae: No acute fracture. No primary bone lesion or focal pathologic process. Soft tissues and spinal canal: No prevertebral fluid or swelling. No visible canal hematoma. Disc levels: Discs are well maintained in height. No evidence of a disc herniation. Central spinal canal and neural foramina are well preserved. Upper  chest: Unremarkable. No masses or adenopathy. Clear lung apices. Other: None. IMPRESSION: HEAD CT 1. No intracranial abnormality. 2. No skull fracture. CERVICAL CT 1. Normal. Electronically Signed   By: Dedra Skeens.D.  On: 09/12/2017 12:52   Ct Chest W Contrast  Result Date: 09/12/2017 CLINICAL DATA:  32 year old male with acute chest, abdominal and pelvic pain following 30 foot fall today. Initial encounter. EXAM: CT CHEST, ABDOMEN, AND PELVIS WITH CONTRAST TECHNIQUE: Multidetector CT imaging of the chest, abdomen and pelvis was performed following the standard protocol during bolus administration of intravenous contrast. CONTRAST:  151m ISOVUE-370 IOPAMIDOL (ISOVUE-370) INJECTION 76% COMPARISON:  None. FINDINGS: CT CHEST FINDINGS Cardiovascular: No significant vascular findings. Normal heart size. No pericardial effusion. Mediastinum/Nodes: No enlarged mediastinal, hilar, or axillary lymph nodes. Thyroid gland, trachea, and esophagus demonstrate no significant findings. Lungs/Pleura: Minimal ground-glass opacity within the posteromedial RIGHT LOWER lobe noted and may represent mild contusion. No airspace disease, consolidation, suspicious nodule, mass, pleural effusion or pneumothorax. Musculoskeletal: Minimal irregularity of the anterior LEFT 3rd, 4th and 5th ribs may represent nondisplaced fractures of uncertain chronicity. A remote appearing compression fracture of T9 is noted. CT ABDOMEN PELVIS FINDINGS Hepatobiliary: The liver and gallbladder are unremarkable. No biliary dilatation. Pancreas: Unremarkable Spleen: Unremarkable Adrenals/Urinary Tract: Kidneys, adrenal glands and bladder are unremarkable. Stomach/Bowel: Stomach is within normal limits. Appendix appears normal. No evidence of bowel wall thickening, distention, or inflammatory changes. Vascular/Lymphatic: No significant vascular findings are present. No enlarged abdominal or pelvic lymph nodes. Reproductive: Prostate is unremarkable.  Other: No ascites, pneumoperitoneum, focal collection or hematoma. Musculoskeletal: No acute or significant osseous findings. IMPRESSION: 1. Minimal irregularity of the anterior LEFT 3rd, 4th and 5th ribs which may represent fractures of uncertain chronicity. 2. Ground-glass opacity within the posteromedial RIGHT LOWER lobe which may represent mild contusion. 3. No acute abnormalities within the abdomen or pelvis. 4. Remote T9 compression fracture Electronically Signed   By: JMargarette CanadaM.D.   On: 09/12/2017 13:06   Ct Cervical Spine Wo Contrast  Result Date: 09/12/2017 CLINICAL DATA:  Patient fell from 50 feet. EXAM: CT HEAD WITHOUT CONTRAST CT CERVICAL SPINE WITHOUT CONTRAST TECHNIQUE: Multidetector CT imaging of the head and cervical spine was performed following the standard protocol without intravenous contrast. Multiplanar CT image reconstructions of the cervical spine were also generated. COMPARISON:  None. FINDINGS: CT HEAD FINDINGS Brain: No evidence of acute infarction, hemorrhage, hydrocephalus, extra-axial collection or mass lesion/mass effect. Vascular: No hyperdense vessel or unexpected calcification. Skull: Normal. Negative for fracture or focal lesion. Sinuses/Orbits: Globes and orbits are unremarkable. Sinuses and mastoid air cells are clear. Other: None. CT CERVICAL SPINE FINDINGS Alignment: Normal. Skull base and vertebrae: No acute fracture. No primary bone lesion or focal pathologic process. Soft tissues and spinal canal: No prevertebral fluid or swelling. No visible canal hematoma. Disc levels: Discs are well maintained in height. No evidence of a disc herniation. Central spinal canal and neural foramina are well preserved. Upper chest: Unremarkable. No masses or adenopathy. Clear lung apices. Other: None. IMPRESSION: HEAD CT 1. No intracranial abnormality. 2. No skull fracture. CERVICAL CT 1. Normal. Electronically Signed   By: DLajean ManesM.D.   On: 09/12/2017 12:52   Ct Abdomen Pelvis  W Contrast  Result Date: 09/12/2017 CLINICAL DATA:  [age]year old male with acute chest, abdominal and pelvic pain following 30 foot fall today. Initial encounter. EXAM: CT CHEST, ABDOMEN, AND PELVIS WITH CONTRAST TECHNIQUE: Multidetector CT imaging of the chest, abdomen and pelvis was performed following the standard protocol during bolus administration of intravenous contrast. CONTRAST:  1068mISOVUE-370 IOPAMIDOL (ISOVUE-370) INJECTION 76% COMPARISON:  None. FINDINGS: CT CHEST FINDINGS Cardiovascular: No significant vascular findings. Normal heart size. No pericardial effusion. Mediastinum/Nodes:  No enlarged mediastinal, hilar, or axillary lymph nodes. Thyroid gland, trachea, and esophagus demonstrate no significant findings. Lungs/Pleura: Minimal ground-glass opacity within the posteromedial RIGHT LOWER lobe noted and may represent mild contusion. No airspace disease, consolidation, suspicious nodule, mass, pleural effusion or pneumothorax. Musculoskeletal: Minimal irregularity of the anterior LEFT 3rd, 4th and 5th ribs may represent nondisplaced fractures of uncertain chronicity. A remote appearing compression fracture of T9 is noted. CT ABDOMEN PELVIS FINDINGS Hepatobiliary: The liver and gallbladder are unremarkable. No biliary dilatation. Pancreas: Unremarkable Spleen: Unremarkable Adrenals/Urinary Tract: Kidneys, adrenal glands and bladder are unremarkable. Stomach/Bowel: Stomach is within normal limits. Appendix appears normal. No evidence of bowel wall thickening, distention, or inflammatory changes. Vascular/Lymphatic: No significant vascular findings are present. No enlarged abdominal or pelvic lymph nodes. Reproductive: Prostate is unremarkable. Other: No ascites, pneumoperitoneum, focal collection or hematoma. Musculoskeletal: No acute or significant osseous findings. IMPRESSION: 1. Minimal irregularity of the anterior LEFT 3rd, 4th and 5th ribs which may represent fractures of uncertain chronicity. 2.  Ground-glass opacity within the posteromedial RIGHT LOWER lobe which may represent mild contusion. 3. No acute abnormalities within the abdomen or pelvis. 4. Remote T9 compression fracture Electronically Signed   By: Margarette Canada M.D.   On: 09/12/2017 13:06   Ct Angio Ao+bifem W & Or Wo Contrast  Result Date: 09/12/2017 CLINICAL DATA:  Fell 50 feet, femur fracture, decreased dorsalis pedis pulse EXAM: CT ANGIOGRAPHY OF ABDOMINAL AORTA WITH ILIOFEMORAL RUNOFF TECHNIQUE: Multidetector CT imaging of the abdomen, pelvis and lower extremities was performed using the standard protocol during bolus administration of intravenous contrast. Multiplanar CT image reconstructions and MIPs were obtained to evaluate the vascular anatomy. CONTRAST:  136m ISOVUE-370 IOPAMIDOL (ISOVUE-370) INJECTION 76% COMPARISON:  None. FINDINGS: VASCULAR Aorta: Normal caliber aorta without aneurysm, dissection, vasculitis or significant stenosis. Celiac: Short-segment narrowing just beyond the origin presumably related to the median arcuate ligament of the diaphragm, widely patent distally. SMA: Patent without evidence of aneurysm, dissection, vasculitis or significant stenosis. Some image degradation secondary to motion. Renals: Both renal arteries are patent without evidence of aneurysm, dissection, vasculitis, fibromuscular dysplasia or significant stenosis. IMA: Patent without evidence of aneurysm, dissection, vasculitis or significant stenosis. RIGHT Lower Extremity Inflow: Common, internal and external iliac arteries are patent without evidence of aneurysm, dissection, vasculitis or significant stenosis. Outflow: Common, superficial and profunda femoral arteries and the popliteal artery are patent without evidence of aneurysm, dissection, vasculitis or significant stenosis. Runoff: Patent three vessel runoff to the ankle. LEFT Lower Extremity Inflow: Common, internal and external iliac arteries are patent without evidence of aneurysm,  dissection, vasculitis or significant stenosis. Outflow: Short-segment occlusion in the distal SFA at the level of the adductor hiatus extending over length of less than 2 cm, vessel unremarkable proximally and distally. No extravasation or pseudoaneurysm. Common femoral artery, deep femoral branches, proximal SFA, and popliteal artery are widely patent and otherwise unremarkable. Runoff: Patent three vessel runoff to the ankle. Posterior tibial artery patent across the ankle. Distal anterior tibial artery is diminutive. The dorsalis pedis is not visualized may be secondary to relatively diminutive size versus embolus. Veins: No obvious venous abnormality within the limitations of this arterial phase study. Review of the MIP images confirms the above findings. NON-VASCULAR See current CTA abdomen pelvis report dictated separately Musculoskeletal: Comminuted midshaft left femur fracture with greater than shaft with posterior displacement of distal fracture fragment. IMPRESSION: VASCULAR 1. Short-segment occlusion of the distal left SFA at the adductor hiatus, involving a length of less than 2  cm, probably related to intimal injury given recent trauma. No active extravasation or pseudoaneurysm. 2. Diminutive left anterior tibial artery with nonvisualization of dorsalis pedis. 3. No other significant vascular findings. NON-VASCULAR 1. Comminuted displaced midshaft left femur fracture. Electronically Signed   By: Lucrezia Europe M.D.   On: 09/12/2017 13:19   Dg Pelvis Portable  Result Date: 09/12/2017 CLINICAL DATA:  Pt fell 30 feet from a tree, pains lower lumbar, left femur, left femur deformity., no chest complaints, hx htn, EXAM: PORTABLE PELVIS 1-2 VIEWS COMPARISON:  None. FINDINGS: There is no evidence of pelvic fracture or diastasis. No pelvic bone lesions are seen. IMPRESSION: Negative. Electronically Signed   By: Lajean Manes M.D.   On: 09/12/2017 11:52   Dg Chest Port 1 View  Result Date: 09/12/2017 CLINICAL  DATA:  Pt fell 30 feet from a tree, pains lower lumbar, left femur, left femur deformity., no chest complaints, hx htn, EXAM: PORTABLE CHEST 1 VIEW COMPARISON:  None. FINDINGS: Normal heart, mediastinum and hila. Clear lungs. No pleural effusion or pneumothorax. Skeletal structures are intact. IMPRESSION: No active disease. Electronically Signed   By: Lajean Manes M.D.   On: 09/12/2017 11:51   Dg Femur Portable Min 2 Views Left  Result Date: 09/12/2017 CLINICAL DATA:  Pt fell 30 feet from a tree, pains lower lumbar, left femur, left femur deformity., no chest complaints, hx htn, EXAM: LEFT FEMUR PORTABLE 2 VIEWS COMPARISON:  None. FINDINGS: Comminuted fracture of the midshaft of the left femur. There is fracture displacement. The primary distal fracture component has displaced posteriorly in relation to the proximal component by 5 cm. It has displaced laterally by 1.8 cm and is overlap/foreshortened by approximately 3 cm. There are intervening comminuted fracture components, the largest of which measures 4.6 cm in length. There is also mild medial angulation of the distal component in relation to the proximal component of approximately 13 degrees. No other fractures.  Hip and knee joints are normally aligned. There is surrounding soft tissue swelling. IMPRESSION: 1. Comminuted, displaced and mildly angulated fracture of the midshaft of the left femur as detailed above. Electronically Signed   By: Lajean Manes M.D.   On: 09/12/2017 11:55    Review of Systems  Constitutional: Negative.   Eyes: Negative.   Respiratory: Negative.   Cardiovascular: Negative.   Musculoskeletal: Positive for back pain (mild).  Skin: Negative.   All other systems reviewed and are negative.  Blood pressure (!) 157/97, pulse (!) 101, temperature 97.7 F (36.5 C), temperature source Temporal, resp. rate 12, height 5' 10" (1.778 m), weight 81.6 kg, SpO2 98 %. Physical Exam  Vitals reviewed. Constitutional: He is oriented to  person, place, and time. He appears well-developed and well-nourished.  HENT:  Head: Normocephalic and atraumatic.  Eyes: Pupils are equal, round, and reactive to light. Conjunctivae and EOM are normal.  Neck: Normal range of motion. Neck supple.  No neck tenderness or pain.  No neurological deficits.  C-spine cleared based on negative CT and clinical examination  Cardiovascular: Normal rate and regular rhythm.  Respiratory: Effort normal and breath sounds normal.  No crepitance or chest wall tenderness.  Breath sounds are equal  GI: Soft. Bowel sounds are normal.  Genitourinary: Penis normal.  Musculoskeletal: Normal range of motion.  Neurological: He is alert and oriented to person, place, and time. He has normal reflexes.  Skin: Skin is warm and dry.  Psychiatric: He has a normal mood and affect. His behavior is normal. Judgment and  thought content normal.    Assessment/Plan: Fall from height of 30 feet Major injury is left mid-shaft femur fracture with left SFA either transection or segmental occlusion. Mild deformities on CT chest do not support fractures clinically No other significant injuries.  We will be available to see the patient if condition changes, b ut otherwise he is clear to go to the OR for orthopeidc and vascualr injuries.  Judeth Horn 09/12/2017, 1:28 PM

## 2017-09-12 NOTE — ED Notes (Signed)
10 pounds of traction placed to left leg by ortho tech.

## 2017-09-12 NOTE — ED Provider Notes (Signed)
[facility] EMERGENCY DEPARTMENT Provider Note   CSN: [id number] Arrival date & time: 09/12/17  1120     History   Chief Complaint Chief Complaint  Patient presents with  . Fall    HPI JOHNWILLIAM SHEPPERSON is a 32 y.o. male.  The history is provided by the patient and the EMS personnel. No language interpreter was used.  Fall    GENIE MIRABAL is a 32 y.o. male who presents to the Emergency Department complaining of fall. He presents to the emergency department as a level II trauma alert following a fall, 30 feet while trimming a tree. He was able to catch a limb on his way down. He did land flat on his back. He endorses severe pain to his left femur, moderate pain to low back. He is unsure if he passed out but he does not recall about 10 minutes prior to the accident occurring. He has a history of hypertension, no additional medical problems. Past Medical History:  Diagnosis Date  . Hypertension     Patient Active Problem List   Diagnosis Date Noted  . Rupture of artery (HCC) 09/12/2017    Past Surgical History:  Procedure Laterality Date  . BACK SURGERY          Home Medications    Prior to Admission medications   Not on File    Family History History reviewed. No pertinent family history.  Social History Social History   Tobacco Use  . Smoking status: Never Smoker  Substance Use Topics  . Alcohol use: Yes  . Drug use: Never     Allergies   Benadryl [diphenhydramine]   Review of Systems Review of Systems  All other systems reviewed and are negative.    Physical Exam Updated Vital Signs BP (!) 157/97   Pulse (!) 101   Temp 97.7 F (36.5 C) (Temporal)   Resp 12   Ht 5\' 10"  (1.778 m)   Wt 81.6 kg   SpO2 98%   BMI 25.83 kg/m   Physical Exam  Constitutional: He is oriented to person, place, and time. He appears well-developed and well-nourished.  HENT:  Head: Normocephalic and atraumatic.  Cardiovascular: Normal rate and  regular rhythm.  No murmur heard. Pulmonary/Chest: Effort normal and breath sounds normal. No respiratory distress.  Abdominal: Soft. There is no tenderness. There is no rebound and no guarding.  Musculoskeletal:  Cool left foot. Doplerable left DP pulse, 2+ right DP pulse.  Deformity, swelling and pain to left mid thigh.  No pelvis tenderness.    Neurological: He is alert and oriented to person, place, and time.  Skin: Skin is warm and dry.  Psychiatric: He has a normal mood and affect. His behavior is normal.  Nursing note and vitals reviewed.    ED Treatments / Results  Labs (all labs ordered are listed, but only abnormal results are displayed) Labs Reviewed  COMPREHENSIVE METABOLIC PANEL - Abnormal; Notable for the following components:      Result Value   Glucose, Bld 128 (*)    AST 50 (*)    ALT 48 (*)    Total Bilirubin 1.3 (*)    All other components within normal limits  CBC - Abnormal; Notable for the following components:   WBC 14.5 (*)    All other components within normal limits  I-STAT CHEM 8, ED - Abnormal; Notable for the following components:   Potassium 3.3 (*)    Glucose, Bld 128 (*)  Calcium, Ion 1.11 (*)    All other components within normal limits  ETHANOL  PROTIME-INR  CDS SEROLOGY  URINALYSIS, ROUTINE W REFLEX MICROSCOPIC  I-STAT CG4 LACTIC ACID, ED  SAMPLE TO BLOOD BANK  TYPE AND SCREEN  PREPARE RBC (CROSSMATCH)  ABO/RH    EKG None  Radiology Ct Head Wo Contrast  Result Date: 09/12/2017 CLINICAL DATA:  Patient fell from 50 feet. EXAM: CT HEAD WITHOUT CONTRAST CT CERVICAL SPINE WITHOUT CONTRAST TECHNIQUE: Multidetector CT imaging of the head and cervical spine was performed following the standard protocol without intravenous contrast. Multiplanar CT image reconstructions of the cervical spine were also generated. COMPARISON:  None. FINDINGS: CT HEAD FINDINGS Brain: No evidence of acute infarction, hemorrhage, hydrocephalus, extra-axial  collection or mass lesion/mass effect. Vascular: No hyperdense vessel or unexpected calcification. Skull: Normal. Negative for fracture or focal lesion. Sinuses/Orbits: Globes and orbits are unremarkable. Sinuses and mastoid air cells are clear. Other: None. CT CERVICAL SPINE FINDINGS Alignment: Normal. Skull base and vertebrae: No acute fracture. No primary bone lesion or focal pathologic process. Soft tissues and spinal canal: No prevertebral fluid or swelling. No visible canal hematoma. Disc levels: Discs are well maintained in height. No evidence of a disc herniation. Central spinal canal and neural foramina are well preserved. Upper chest: Unremarkable. No masses or adenopathy. Clear lung apices. Other: None. IMPRESSION: HEAD CT 1. No intracranial abnormality. 2. No skull fracture. CERVICAL CT 1. Normal. Electronically Signed   By: Amie Portland M.D.   On: 09/12/2017 12:52   Ct Chest W Contrast  Result Date: 09/12/2017 CLINICAL DATA:  32 year old male with acute chest, abdominal and pelvic pain following 30 foot fall today. Initial encounter. EXAM: CT CHEST, ABDOMEN, AND PELVIS WITH CONTRAST TECHNIQUE: Multidetector CT imaging of the chest, abdomen and pelvis was performed following the standard protocol during bolus administration of intravenous contrast. CONTRAST:  ISOVUE-370 IOPAMIDOL (ISOVUE-370) INJECTION 76% COMPARISON:  None. FINDINGS: CT CHEST FINDINGS Cardiovascular: No significant vascular findings. Normal heart size. No pericardial effusion. Mediastinum/Nodes: No enlarged mediastinal, hilar, or axillary lymph nodes. Thyroid gland, trachea, and esophagus demonstrate no significant findings. Lungs/Pleura: Minimal ground-glass opacity within the posteromedial RIGHT LOWER lobe noted and may represent mild contusion. No airspace disease, consolidation, suspicious nodule, mass, pleural effusion or pneumothorax. Musculoskeletal: Minimal irregularity of the anterior LEFT 3rd, 4th and 5th ribs may  represent nondisplaced fractures of uncertain chronicity. A remote appearing compression fracture of T9 is noted. CT ABDOMEN PELVIS FINDINGS Hepatobiliary: The liver and gallbladder are unremarkable. No biliary dilatation. Pancreas: Unremarkable Spleen: Unremarkable Adrenals/Urinary Tract: Kidneys, adrenal glands and bladder are unremarkable. Stomach/Bowel: Stomach is within normal limits. Appendix appears normal. No evidence of bowel wall thickening, distention, or inflammatory changes. Vascular/Lymphatic: No significant vascular findings are present. No enlarged abdominal or pelvic lymph nodes. Reproductive: Prostate is unremarkable. Other: No ascites, pneumoperitoneum, focal collection or hematoma. Musculoskeletal: No acute or significant osseous findings. IMPRESSION: 1. Minimal irregularity of the anterior LEFT 3rd, 4th and 5th ribs which may represent fractures of uncertain chronicity. 2. Ground-glass opacity within the posteromedial RIGHT LOWER lobe which may represent mild contusion. 3. No acute abnormalities within the abdomen or pelvis. 4. Remote T9 compression fracture Electronically Signed   By: Harmon Pier M.D.   On: 09/12/2017 13:06   Ct Cervical Spine Wo Contrast  Result Date: 09/12/2017 CLINICAL DATA:  Patient fell from 50 feet. EXAM: CT HEAD WITHOUT CONTRAST CT CERVICAL SPINE WITHOUT CONTRAST TECHNIQUE: Multidetector CT imaging of the head and cervical spine  was performed following the standard protocol without intravenous contrast. Multiplanar CT image reconstructions of the cervical spine were also generated. COMPARISON:  None. FINDINGS: CT HEAD FINDINGS Brain: No evidence of acute infarction, hemorrhage, hydrocephalus, extra-axial collection or mass lesion/mass effect. Vascular: No hyperdense vessel or unexpected calcification. Skull: Normal. Negative for fracture or focal lesion. Sinuses/Orbits: Globes and orbits are unremarkable. Sinuses and mastoid air cells are clear. Other: None. CT  CERVICAL SPINE FINDINGS Alignment: Normal. Skull base and vertebrae: No acute fracture. No primary bone lesion or focal pathologic process. Soft tissues and spinal canal: No prevertebral fluid or swelling. No visible canal hematoma. Disc levels: Discs are well maintained in height. No evidence of a disc herniation. Central spinal canal and neural foramina are well preserved. Upper chest: Unremarkable. No masses or adenopathy. Clear lung apices. Other: None. IMPRESSION: HEAD CT 1. No intracranial abnormality. 2. No skull fracture. CERVICAL CT 1. Normal. Electronically Signed   By: Amie Portland M.D.   On: 09/12/2017 12:52   Ct Abdomen Pelvis W Contrast  Result Date: 09/12/2017 CLINICAL DATA:  32 year old male with acute chest, abdominal and pelvic pain following 30 foot fall today. Initial encounter. EXAM: CT CHEST, ABDOMEN, AND PELVIS WITH CONTRAST TECHNIQUE: Multidetector CT imaging of the chest, abdomen and pelvis was performed following the standard protocol during bolus administration of intravenous contrast. CONTRAST:  ISOVUE-370 IOPAMIDOL (ISOVUE-370) INJECTION 76% COMPARISON:  None. FINDINGS: CT CHEST FINDINGS Cardiovascular: No significant vascular findings. Normal heart size. No pericardial effusion. Mediastinum/Nodes: No enlarged mediastinal, hilar, or axillary lymph nodes. Thyroid gland, trachea, and esophagus demonstrate no significant findings. Lungs/Pleura: Minimal ground-glass opacity within the posteromedial RIGHT LOWER lobe noted and may represent mild contusion. No airspace disease, consolidation, suspicious nodule, mass, pleural effusion or pneumothorax. Musculoskeletal: Minimal irregularity of the anterior LEFT 3rd, 4th and 5th ribs may represent nondisplaced fractures of uncertain chronicity. A remote appearing compression fracture of T9 is noted. CT ABDOMEN PELVIS FINDINGS Hepatobiliary: The liver and gallbladder are unremarkable. No biliary dilatation. Pancreas: Unremarkable Spleen:  Unremarkable Adrenals/Urinary Tract: Kidneys, adrenal glands and bladder are unremarkable. Stomach/Bowel: Stomach is within normal limits. Appendix appears normal. No evidence of bowel wall thickening, distention, or inflammatory changes. Vascular/Lymphatic: No significant vascular findings are present. No enlarged abdominal or pelvic lymph nodes. Reproductive: Prostate is unremarkable. Other: No ascites, pneumoperitoneum, focal collection or hematoma. Musculoskeletal: No acute or significant osseous findings. IMPRESSION: 1. Minimal irregularity of the anterior LEFT 3rd, 4th and 5th ribs which may represent fractures of uncertain chronicity. 2. Ground-glass opacity within the posteromedial RIGHT LOWER lobe which may represent mild contusion. 3. No acute abnormalities within the abdomen or pelvis. 4. Remote T9 compression fracture Electronically Signed   By: Harmon Pier M.D.   On: 09/12/2017 13:06   Ct Angio Ao+bifem W & Or Wo Contrast  Result Date: 09/12/2017 CLINICAL DATA:  Fell 50 feet, femur fracture, decreased dorsalis pedis pulse EXAM: CT ANGIOGRAPHY OF ABDOMINAL AORTA WITH ILIOFEMORAL RUNOFF TECHNIQUE: Multidetector CT imaging of the abdomen, pelvis and lower extremities was performed using the standard protocol during bolus administration of intravenous contrast. Multiplanar CT image reconstructions and MIPs were obtained to evaluate the vascular anatomy. CONTRAST:  ISOVUE-370 IOPAMIDOL (ISOVUE-370) INJECTION 76% COMPARISON:  None. FINDINGS: VASCULAR Aorta: Normal caliber aorta without aneurysm, dissection, vasculitis or significant stenosis. Celiac: Short-segment narrowing just beyond the origin presumably related to the median arcuate ligament of the diaphragm, widely patent distally. SMA: Patent without evidence of aneurysm, dissection, vasculitis or significant stenosis. Some image  degradation secondary to motion. Renals: Both renal arteries are patent without evidence of aneurysm, dissection,  vasculitis, fibromuscular dysplasia or significant stenosis. IMA: Patent without evidence of aneurysm, dissection, vasculitis or significant stenosis. RIGHT Lower Extremity Inflow: Common, internal and external iliac arteries are patent without evidence of aneurysm, dissection, vasculitis or significant stenosis. Outflow: Common, superficial and profunda femoral arteries and the popliteal artery are patent without evidence of aneurysm, dissection, vasculitis or significant stenosis. Runoff: Patent three vessel runoff to the ankle. LEFT Lower Extremity Inflow: Common, internal and external iliac arteries are patent without evidence of aneurysm, dissection, vasculitis or significant stenosis. Outflow: Short-segment occlusion in the distal SFA at the level of the adductor hiatus extending over length of less than 2 cm, vessel unremarkable proximally and distally. No extravasation or pseudoaneurysm. Common femoral artery, deep femoral branches, proximal SFA, and popliteal artery are widely patent and otherwise unremarkable. Runoff: Patent three vessel runoff to the ankle. Posterior tibial artery patent across the ankle. Distal anterior tibial artery is diminutive. The dorsalis pedis is not visualized may be secondary to relatively diminutive size versus embolus. Veins: No obvious venous abnormality within the limitations of this arterial phase study. Review of the MIP images confirms the above findings. NON-VASCULAR See current CTA abdomen pelvis report dictated separately Musculoskeletal: Comminuted midshaft left femur fracture with greater than shaft with posterior displacement of distal fracture fragment. IMPRESSION: VASCULAR 1. Short-segment occlusion of the distal left SFA at the adductor hiatus, involving a length of less than 2 cm, probably related to intimal injury given recent trauma. No active extravasation or pseudoaneurysm. 2. Diminutive left anterior tibial artery with nonvisualization of dorsalis pedis. 3.  No other significant vascular findings. NON-VASCULAR 1. Comminuted displaced midshaft left femur fracture. Electronically Signed   By: Corlis Leak M.D.   On: 09/12/2017 13:19   Dg Pelvis Portable  Result Date: 09/12/2017 CLINICAL DATA:  Pt fell 30 feet from a tree, pains lower lumbar, left femur, left femur deformity., no chest complaints, hx htn, EXAM: PORTABLE PELVIS 1-2 VIEWS COMPARISON:  None. FINDINGS: There is no evidence of pelvic fracture or diastasis. No pelvic bone lesions are seen. IMPRESSION: Negative. Electronically Signed   By: Amie Portland M.D.   On: 09/12/2017 11:52   Dg Chest Port 1 View  Result Date: 09/12/2017 CLINICAL DATA:  Pt fell 30 feet from a tree, pains lower lumbar, left femur, left femur deformity., no chest complaints, hx htn, EXAM: PORTABLE CHEST 1 VIEW COMPARISON:  None. FINDINGS: Normal heart, mediastinum and hila. Clear lungs. No pleural effusion or pneumothorax. Skeletal structures are intact. IMPRESSION: No active disease. Electronically Signed   By: Amie Portland M.D.   On: 09/12/2017 11:51   Dg C-arm 1-60 Min  Result Date: 09/12/2017 CLINICAL DATA:  Imaging from ORIF of a left femur fracture. EXAM: DG C-ARM 61-120 MIN; LEFT FEMUR 2 VIEWS COMPARISON:  Preop imaging obtained earlier today. FINDINGS: Images show placement of an intramedullary rod from the trochanteric region of the left proximal femur to the distal metaphysis. The comminuted displaced fracture of the midshaft has been reduced. Primary fracture components are in near anatomic alignment. The orthopedic hardware appears well seated. IMPRESSION: Well aligned primary fracture components of the midshaft left femur fracture following ORIF. Electronically Signed   By: Amie Portland M.D.   On: 09/12/2017 15:52   Dg Femur Min 2 Views Left  Result Date: 09/12/2017 CLINICAL DATA:  Imaging from ORIF of a left femur fracture. EXAM: DG C-ARM 61-120 MIN; LEFT  FEMUR 2 VIEWS COMPARISON:  Preop imaging obtained earlier  today. FINDINGS: Images show placement of an intramedullary rod from the trochanteric region of the left proximal femur to the distal metaphysis. The comminuted displaced fracture of the midshaft has been reduced. Primary fracture components are in near anatomic alignment. The orthopedic hardware appears well seated. IMPRESSION: Well aligned primary fracture components of the midshaft left femur fracture following ORIF. Electronically Signed   By: Amie Portland M.D.   On: 09/12/2017 15:52   Dg Femur Portable Min 2 Views Left  Result Date: 09/12/2017 CLINICAL DATA:  Pt fell 30 feet from a tree, pains lower lumbar, left femur, left femur deformity., no chest complaints, hx htn, EXAM: LEFT FEMUR PORTABLE 2 VIEWS COMPARISON:  None. FINDINGS: Comminuted fracture of the midshaft of the left femur. There is fracture displacement. The primary distal fracture component has displaced posteriorly in relation to the proximal component by 5 cm. It has displaced laterally by 1.8 cm and is overlap/foreshortened by approximately 3 cm. There are intervening comminuted fracture components, the largest of which measures 4.6 cm in length. There is also mild medial angulation of the distal component in relation to the proximal component of approximately 13 degrees. No other fractures.  Hip and knee joints are normally aligned. There is surrounding soft tissue swelling. IMPRESSION: 1. Comminuted, displaced and mildly angulated fracture of the midshaft of the left femur as detailed above. Electronically Signed   By: Amie Portland M.D.   On: 09/12/2017 11:55    Procedures Procedures (including critical care time) CRITICAL CARE Performed by: Tilden Fossa   Total critical care time: 35 minutes  Critical care time was exclusive of separately billable procedures and treating other patients.  Critical care was necessary to treat or prevent imminent or life-threatening deterioration.  Critical care was time spent personally  by me on the following activities: development of treatment plan with patient and/or surrogate as well as nursing, discussions with consultants, evaluation of patient's response to treatment, examination of patient, obtaining history from patient or surrogate, ordering and performing treatments and interventions, ordering and review of laboratory studies, ordering and review of radiographic studies, pulse oximetry and re-evaluation of patient's condition.  Medications Ordered in ED Medications  fentaNYL (SUBLIMAZE) 100 MCG/2ML injection (has no administration in time range)  iopamidol (ISOVUE-300) 61 % injection (has no administration in time range)  iopamidol (ISOVUE-300) 61 % injection 100 mL ( Intravenous MAR Hold 09/12/17 1308)  fentaNYL (SUBLIMAZE) 100 MCG/2ML injection (has no administration in time range)  HYDROmorphone (DILAUDID) 1 MG/ML injection (has no administration in time range)  0.9 %  sodium chloride infusion (Manually program via Guardrails IV Fluids) (has no administration in time range)  0.9 % irrigation (POUR BTL) (1,000 mLs Irrigation Given 09/12/17 1549)  heparin 6,000 Units in sodium chloride 0.9 % 500 mL irrigation (500 mLs Irrigation Given 09/12/17 1447)  papaverine injection (60 mg Intravenous Given 09/12/17 1551)  iopamidol (ISOVUE-370) 76 % injection 100 mL (100 mLs Intravenous Contrast Given 09/12/17 1152)  fentaNYL (SUBLIMAZE) injection 100 mcg (100 mcg Intravenous Given 09/12/17 1150)  fentaNYL (SUBLIMAZE) injection 100 mcg (100 mcg Intravenous Given 09/12/17 1147)  HYDROmorphone (DILAUDID) injection 1 mg (1 mg Intravenous Given 09/12/17 1233)     Initial Impression / Assessment and Plan / ED Course  I have reviewed the triage vital signs and the nursing notes.  Pertinent labs & imaging results that were available during my care of the patient were reviewed by me and considered  in my medical decision making (see chart for details).     Patient presents as a level II trauma  alert following a fall from 30 feet. He has significant pain was deformity to the left thigh, absent left pedal pulse. Plain films demonstrate dementia femur fracture. Given mechanism and patient's amnesia, distracting injury trauma scans were obtained. CTA of the left leg was obtained given diminished pulse. Orthopedics consulted regarding femur fracture.  CTA demonstrates injury to the SFA, vascular surgery consulted. Trauma consulted regarding injuries. Patient updated of findings of studies and need   for admission for further treatment and he is in agreement with plan.  Final Clinical Impressions(s) / ED Diagnoses   Final diagnoses:  Closed displaced comminuted fracture of shaft of left femur, initial encounter [facility])    ED Discharge Orders    None       Tilden Fossa, MD 09/12/17 1715

## 2017-09-12 NOTE — Progress Notes (Signed)
Received patient from PACU to 4E19.

## 2017-09-12 NOTE — ED Notes (Signed)
Trauma end  

## 2017-09-12 NOTE — ED Triage Notes (Signed)
Arrived via EMS patient fell from a tree approximately 30 feet landing on back witnessed fall. No LOC Obvious deformity left femur and traction applied prior to arrival. Alert answering and following commands appropriate.

## 2017-09-12 NOTE — Anesthesia Procedure Notes (Signed)
Procedure Name: Intubation Date/Time: 09/12/2017 1:33 PM Performed by: Inda Coke, CRNA Pre-anesthesia Checklist: Patient identified, Emergency Drugs available, Suction available and Patient being monitored Patient Re-evaluated:Patient Re-evaluated prior to induction Oxygen Delivery Method: Circle System Utilized Preoxygenation: Pre-oxygenation with 100% oxygen Induction Type: IV induction, Cricoid Pressure applied and Rapid sequence Ventilation: Mask ventilation without difficulty Laryngoscope Size: Mac and 4 Grade View: Grade I Tube type: Subglottic suction tube Tube size: 8.0 mm Number of attempts: 1 Airway Equipment and Method: Stylet and Oral airway Placement Confirmation: ETT inserted through vocal cords under direct vision,  positive ETCO2 and breath sounds checked- equal and bilateral Secured at: 22 cm Tube secured with: Tape Dental Injury: Teeth and Oropharynx as per pre-operative assessment

## 2017-09-12 NOTE — ED Notes (Signed)
Returned from CT and placed on hospital bed for traction to be placed.

## 2017-09-12 NOTE — Consult Note (Addendum)
Reason for Consult:Femur fx Referring Physician: E Jasir Kelley is an 32 y.o. male.  HPI: Clinton Kelley was high in a tree and fell >4f. He hit some branches on the way down. He was brought in as a level 2 trauma activation. He had a left thigh deformity and a pulseless left foot. Orthopedic surgery was consulted. CT angio confirmed a left SFA transection. VVS was consulted. He c/o LLE and back pain.  Past Medical History:  Diagnosis Date  . Hypertension     Past Surgical History:  Procedure Laterality Date  . BACK SURGERY      No family history on file.  Social History:  reports that he has never smoked. He does not have any smokeless tobacco history on file. He reports that he drinks alcohol. He reports that he does not use drugs.  Allergies:  Allergies  Allergen Reactions  . Benadryl [Diphenhydramine]     Medications: I have reviewed the patient's current medications.  Results for orders placed or performed during the hospital encounter of 09/12/17 (from the past 48 hour(s))  Comprehensive metabolic panel     Status: Abnormal   Collection Time: 09/12/17 11:26 AM  Result Value Ref Range   Sodium 142 135 - 145 mmol/L   Potassium 3.5 3.5 - 5.1 mmol/L   Chloride 108 98 - 111 mmol/L   CO2 24 22 - 32 mmol/L   Glucose, Bld 128 (H) 70 - 99 mg/dL   BUN 15 6 - 20 mg/dL   Creatinine, Ser 1.12 0.61 - 1.24 mg/dL   Calcium 9.1 8.9 - 10.3 mg/dL   Total Protein 7.0 6.5 - 8.1 g/dL   Albumin 4.4 3.5 - 5.0 g/dL   AST 50 (H) 15 - 41 U/L   ALT 48 (H) 0 - 44 U/L   Alkaline Phosphatase 53 38 - 126 U/L   Total Bilirubin 1.3 (H) 0.3 - 1.2 mg/dL   GFR calc non Af Amer >60 >60 mL/min   GFR calc Af Amer >60 >60 mL/min    Comment: (NOTE) The eGFR has been calculated using the CKD EPI equation. This calculation has not been validated in all clinical situations. eGFR's persistently <60 mL/min signify possible Chronic Kidney Disease.    Anion gap 10 5 - 15    Comment: Performed at [address], [place] [address] CBC     Status: Abnormal   Collection Time: 09/12/17 11:26 AM  Result Value Ref Range   WBC 14.5 (H) 4.0 - 10.5 K/uL   RBC 4.71 4.22 - 5.81 MIL/uL   Hemoglobin 14.5 13.0 - 17.0 g/dL   HCT 43.1 39.0 - 52.0 %   MCV 91.5 78.0 - 100.0 fL   MCH 30.8 26.0 - 34.0 pg   MCHC 33.6 30.0 - 36.0 g/dL   RDW 12.1 11.5 - 15.5 %   Platelets 225 150 - 400 K/uL    Comment: Performed at [facility] [address] [place] [address] Ethanol     Status: None   Collection Time: 09/12/17 11:26 AM  Result Value Ref Range   Alcohol, Ethyl (B) <10 <10 mg/dL    Comment: (NOTE) Lowest detectable limit for serum alcohol is 10 mg/dL. For medical purposes only. Performed at [facility] [address], [place] [address]  Protime-INR     Status: None   Collection Time: 09/12/17 11:26 AM  Result Value Ref Range  Prothrombin Time 13.0 11.4 - 15.2 seconds   INR 0.99     Comment: Performed at [facility], [place] [address] [place], [place] [address]  Sample to Blood Bank     Status: None   Collection Time: 09/12/17 11:26 AM  Result Value Ref Range   Blood Bank Specimen SAMPLE AVAILABLE FOR TESTING    Sample Expiration      09/13/2017 Performed at [facility], [place] [address] [place], [place] [address]   I-Stat Chem 8, ED     Status: Abnormal   Collection Time: 09/12/17 11:33 AM  Result Value Ref Range   Sodium 140 135 - 145 mmol/L   Potassium 3.3 (L) 3.5 - 5.1 mmol/L   Chloride 106 98 - 111 mmol/L   BUN 19 6 - 20 mg/dL   Creatinine, Ser 1.00 0.61 - 1.24 mg/dL   Glucose, Bld 128 (H) 70 - 99 mg/dL   Calcium, Ion 1.11 (L) 1.15 - 1.40 mmol/L   TCO2 24 22 - 32 mmol/L   Hemoglobin 14.6 13.0 - 17.0 g/dL   HCT 43.0 39.0 - 52.0 %  I-Stat CG4 Lactic Acid, ED     Status: None   Collection Time: 09/12/17 11:34 AM  Result Value Ref Range   Lactic Acid, Venous 1.48 0.5 - 1.9 mmol/L    Dg Pelvis  Portable  Result Date: 09/12/2017 CLINICAL DATA:  Pt fell 30 feet from a tree, pains lower lumbar, left femur, left femur deformity., no chest complaints, hx htn, EXAM: PORTABLE PELVIS 1-2 VIEWS COMPARISON:  None. FINDINGS: There is no evidence of pelvic fracture or diastasis. No pelvic bone lesions are seen. IMPRESSION: Negative. Electronically Signed   By: Lajean Manes M.D.   On: 09/12/2017 11:52   Dg Chest Port 1 View  Result Date: 09/12/2017 CLINICAL DATA:  Pt fell 30 feet from a tree, pains lower lumbar, left femur, left femur deformity., no chest complaints, hx htn, EXAM: PORTABLE CHEST 1 VIEW COMPARISON:  None. FINDINGS: Normal heart, mediastinum and hila. Clear lungs. No pleural effusion or pneumothorax. Skeletal structures are intact. IMPRESSION: No active disease. Electronically Signed   By: Lajean Manes M.D.   On: 09/12/2017 11:51   Dg Femur Portable Min 2 Views Left  Result Date: 09/12/2017 CLINICAL DATA:  Pt fell 30 feet from a tree, pains lower lumbar, left femur, left femur deformity., no chest complaints, hx htn, EXAM: LEFT FEMUR PORTABLE 2 VIEWS COMPARISON:  None. FINDINGS: Comminuted fracture of the midshaft of the left femur. There is fracture displacement. The primary distal fracture component has displaced posteriorly in relation to the proximal component by 5 cm. It has displaced laterally by 1.8 cm and is overlap/foreshortened by approximately 3 cm. There are intervening comminuted fracture components, the largest of which measures 4.6 cm in length. There is also mild medial angulation of the distal component in relation to the proximal component of approximately 13 degrees. No other fractures.  Hip and knee joints are normally aligned. There is surrounding soft tissue swelling. IMPRESSION: 1. Comminuted, displaced and mildly angulated fracture of the midshaft of the left femur as detailed above. Electronically Signed   By: Lajean Manes M.D.   On: 09/12/2017 11:55    Review of  Systems  Constitutional: Negative for weight loss.  HENT: Negative for ear discharge, ear pain, hearing loss and tinnitus.   Eyes: Negative for blurred vision, double vision, photophobia and pain.  Respiratory: Negative for cough, sputum production and shortness  of breath.   Cardiovascular: Negative for chest pain.  Gastrointestinal: Negative for abdominal pain, nausea and vomiting.  Genitourinary: Negative for dysuria, flank pain, frequency and urgency.  Musculoskeletal: Positive for back pain and joint pain (Left thigh). Negative for falls, myalgias and neck pain.  Neurological: Negative for dizziness, tingling, sensory change, focal weakness, loss of consciousness and headaches.  Endo/Heme/Allergies: Does not bruise/bleed easily.  Psychiatric/Behavioral: Negative for depression, memory loss and substance abuse. The patient is not nervous/anxious.    Blood pressure (!) 167/94, pulse 84, temperature 97.7 F (36.5 C), temperature source Temporal, resp. rate 16, height '5\' 10"'$  (1.778 m), weight 81.6 kg, SpO2 96 %. Physical Exam  Constitutional: He appears well-developed and well-nourished. No distress.  HENT:  Head: Normocephalic and atraumatic.  Eyes: Conjunctivae are normal. Right eye exhibits no discharge. Left eye exhibits no discharge. No scleral icterus.  Neck: Normal range of motion.  Cardiovascular: Normal rate and regular rhythm.  Respiratory: Effort normal. No respiratory distress.  Musculoskeletal:  LLE No traumatic wounds, ecchymosis, or rash  Thigh swollen, compartments soft, mod TTP  No knee or ankle effusion  Knee stable to varus/ valgus and anterior/posterior stress  Sens DPN, SPN intact, TN paresthetic  Motor EHL, ext, flex, evers 5/5  DP 0 (+doppler), PT 0, No significant edema  Neurological: He is alert.  Skin: Skin is warm and dry. He is not diaphoretic.  Psychiatric: He has a normal mood and affect. His behavior is normal.    Assessment/Plan: Fall from  tree Left femur fx -- Plan ex fix vs IMN in OR by Dr. Marcelino Scot Left SFA injury -- Plan repair by VVS T9 comp fx -- Assume acute, trauma to eval, likely NS involvement    Lisette Abu, PA-C Orthopedic Surgery [phone] 09/12/2017, 12:43 PM

## 2017-09-12 NOTE — Progress Notes (Signed)
Orthopedic Tech Progress Note Patient Details:  Clinton Kelley 01/07/1873 [id number]  Patient ID: [name], male   DOB: 01/07/1873, 32 y.o.   MRN: [id number]   Nikki Dom 09/12/2017, 11:21 AM Made level 2 trauma visit

## 2017-09-12 NOTE — Brief Op Note (Signed)
09/12/2017  3:10 PM  PATIENT:  Elza Rafter  32 y.o. male  PRE-OPERATIVE DIAGNOSIS:   1. LEFT COMMINUTED FEMORAL SHAFT FRACTURE 2. LEFT SUPERFICIAL FEMORAL ARTERY RUPTURE  POST-OPERATIVE DIAGNOSIS:  1. LEFT COMMINUTED FEMORAL SHAFT FRACTURE 2. LEFT SUPERFICIAL FEMORAL ARTERY RUPTURE  PROCEDURE:  Procedure(s): 1. INTRAMEDULLARY (IM) PHOENIX RETROGRADE FEMORAL NAILING (Left) 9 X 400 MM 2. REPAIR OF LEFT SUPERFICIAL FEMORAL ARTERY RUPTURE (Left)  SURGEON:  Surgeon(s) and Role: Panel 1:    Myrene Galas, MD - Primary Panel 2:    * Chuck Hint, MD - Primary  PHYSICIAN ASSISTANT: Montez Morita, PA-C for Panel 1  ANESTHESIA:   general  EBL:  30 mL   BLOOD ADMINISTERED:none  DRAINS: none   LOCAL MEDICATIONS USED:  NONE  SPECIMEN:  No Specimen  DISPOSITION OF SPECIMEN:  N/A  COUNTS:  YES  TOURNIQUET:  * Missing tourniquet times found for documented tourniquets in log: [id number] *  DICTATION: .Other Dictation: Dictation Number [id number]  PLAN OF CARE: Admit to inpatient   PATIENT DISPOSITION:  PACU - hemodynamically stable.   Delay start of Pharmacological VTE agent (>24hrs) due to surgical blood loss or risk of bleeding: no

## 2017-09-12 NOTE — Op Note (Signed)
NAME: Clinton Kelley    MRN: [id number] DOB: 07/26/1985    DATE OF OPERATION: 09/12/2017  PREOP DIAGNOSIS:    Ischemic left lower extremity with occlusion of the left superficial femoral artery  POSTOP DIAGNOSIS:    Same  PROCEDURE:    1.  Exploration of left superficial femoral artery 2.  Excision of damage segment of artery and repair with interposition saphenous vein graft from left leg  SURGEON: Di Kindle. Edilia Bo, MD, FACS  ASSIST: Doreatha Massed, PA  ANESTHESIA: General  EBL: Per anesthesia record  INDICATIONS:    Clinton Kelley is a 32 y.o. male who fell 30 feet out of a tree.  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  After Dr. Carola Frost fix his broken femur I then scrubbed to repair the artery.  FINDINGS:   The intima was transected.  The artery was thrombosed.  I dissected back to normal-appearing artery at both ends and replace this segment with an approximately 10 cm segment of saphenous vein graft taken from the left leg.  At the completion of the procedure there was a biphasic dorsalis pedis and posterior tibial signal.  TECHNIQUE:   The patient was taken to the operating room and both legs were prepped and draped in usual sterile fashion.  The left femur fracture was fixed as dictated by Dr. Carola Frost.  After this was complete I scrubbed to repair the artery.  The site of the fracture had been identified and I made a longitudinal incision over this area extending proximally and distally to this to expose the superficial femoral artery.  The dissection was carried down proximally to the superficial femoral artery above the zone of injury and proximal control was obtained.  I then followed the artery distally where the artery had thrombosed over a short segment.  I then controlled the artery further distally for distal control.  The patient was heparinized.  The vein was  somewhat small in the thigh and therefore elected to take vein below the knee.  A longitudinal incision was made along the medial aspect of the left knee and the saphenous vein was dissected free here with branches divided between clips and 3-0 silk ties.  The vein was ligated proximally distally and then excised and Then heparinized saline.  After the heparin circulated the artery was clamped proximally distally to the zone of injury.  The artery was then entered and there was an intimal transection and also an intimal flap.  I extended the arteriotomy me proximally into normal-appearing artery beyond the zone of injury.  I then found the true lumen and the dissection and extended this arteriotomy distally until I got normal-appearing artery.  Given the intimal damage this was not something that I could patch.  I therefore elected to repair this with an interposition saphenous vein graft.  Saphenous vein was gently distended with heparinized saline used in a reversed fashion.  It was spatulated proximally.  The artery was spatulated proximally after was divided and the vein graft was sewn into hand to the artery in a hand class fashion using 2 continuous 6-0 Prolene sutures.  The graft then pulled the appropriate length for anastomosis to the artery distally.  The artery here had been spatulated.  The vein graft cut the appropriate length, spatulated end to end to the distal superficial femoral artery and a hand clasped fashion with 2  continuous 6-0 Prolene sutures.  At the completion was an excellent biphasic Doppler signal in the dorsalis pedis and posterior tibial positions.  The calves were soft.  I did open the posterior deep compartment since there was an incision on the medial aspect of the leg and there was no muscle swelling.  The anterior lateral compartments were soft.  The vein harvest incision was closed with a deep layer 3-0 Vicryl and the skin closed with 4-0 Vicryl.  There was some soft  tissue injury related to the bone fracture and damage muscle causing some generalized oozing.  This was irrigated.  I then placed a 19 Blake drain into the area.  This was secured with a 3-0 nylon.  The wound was then closed with a deep layer of 2-0 Vicryl, a subcutaneously layer of 3-0 Vicryl, and the skin closed with 4-0 Vicryl.  The patient tolerated the procedure well transferred to recovery in stable condition.  All needle sponge counts were correct.  Waverly Ferrari, MD, FACS [facility]  DATE OF DICTATION:   09/12/2017

## 2017-09-12 NOTE — ED Notes (Signed)
Patient left leg in traction prior to arrival and pedal pulse not palpable able to obtain pedal pulse by doppler. Left foot pale sensation equal bilaterally and able to move toes.

## 2017-09-12 NOTE — ED Notes (Signed)
Ortho PA at bedside.  

## 2017-09-12 NOTE — Anesthesia Preprocedure Evaluation (Addendum)
Anesthesia Evaluation  Patient identified by MRN, date of birth, ID band Patient awake    Reviewed: Allergy & Precautions, NPO status , Patient's Chart, lab work & pertinent test results  Airway Mallampati: I  TM Distance: >3 FB Neck ROM: Full    Dental  (+) Dental Advisory Given   Pulmonary former smoker,  ?pulm contusion on CT chest  Pt uses vaporized nicotine   breath sounds clear to auscultation       Cardiovascular hypertension, + Peripheral Vascular Disease (SFA transection)   Rhythm:Regular Rate:Normal     Neuro/Psych C spine cleared by trauma in pre-op. Remote T9 compression fx    GI/Hepatic negative GI ROS, Neg liver ROS,   Endo/Other  negative endocrine ROS  Renal/GU negative Renal ROS     Musculoskeletal Femur fx   Abdominal   Peds  Hematology negative hematology ROS (+)   Anesthesia Other Findings   Reproductive/Obstetrics                            Lab Results  Component Value Date   WBC 14.5 (H) 09/12/2017   HGB 14.6 09/12/2017   HCT 43.0 09/12/2017   MCV 91.5 09/12/2017   PLT 225 09/12/2017   Lab Results  Component Value Date   CREATININE 1.00 09/12/2017   BUN 19 09/12/2017   NA 140 09/12/2017   K 3.3 (L) 09/12/2017   CL 106 09/12/2017   CO2 24 09/12/2017    Anesthesia Physical Anesthesia Plan  ASA: III and emergent  Anesthesia Plan: General   Post-op Pain Management:    Induction: Intravenous, Rapid sequence and Cricoid pressure planned  PONV Risk Score and Plan: 2 and Dexamethasone, Ondansetron and Treatment may vary due to age or medical condition  Airway Management Planned: Oral ETT  Additional Equipment:   Intra-op Plan:   Post-operative Plan: Extubation in OR  Informed Consent: I have reviewed the patients History and Physical, chart, labs and discussed the procedure including the risks, benefits and alternatives for the proposed anesthesia  with the patient or authorized representative who has indicated his/her understanding and acceptance.   Dental advisory given  Plan Discussed with:   Anesthesia Plan Comments:         Anesthesia Quick Evaluation

## 2017-09-12 NOTE — Anesthesia Procedure Notes (Signed)
Arterial Line Insertion Start/End9/06/2017 1:25 PM, 09/12/2017 1:40 PM Performed by: Rogelia Boga, CRNA, CRNA  Preanesthetic checklist: patient identified, IV checked, risks and benefits discussed, monitors and equipment checked and pre-op evaluation Left, radial was placed Catheter size: 20 G Maximum sterile barriers used   Attempts: 2 Procedure performed without using ultrasound guided technique. Following insertion, dressing applied and Biopatch. Post procedure assessment: normal and unchanged  Patient tolerated the procedure well with no immediate complications.

## 2017-09-12 NOTE — Op Note (Signed)
NAME: Clinton Kelley, MCCONAUGHY MEDICAL RECORD ZN:[id number] ACCOUNT [id number] DATE OF BIRTH:[birth year] FACILITY: [facility] LOCATION: [facility] PHYSICIAN:Terrion Poblano H. Delon Revelo, MD  OPERATIVE REPORT  DATE OF PROCEDURE:  09/12/2017  PREOPERATIVE DIAGNOSES:   1.  Left comminuted femoral shaft fracture. 2.  Left superficial femoral artery rupture.  POSTOPERATIVE DIAGNOSES:   1.  Left comminuted femoral shaft fracture. 2.  Left superficial femoral artery rupture.  PROCEDURES: 1.  Intramedullary nailing of the left femur using a retrograde Phoenix 9 x 400 mm statically locked nail. 2.  Repair of left superficial femoral artery rupture.  SURGEON:  For panel #1 Myrene Galas MD.  For panel #2 Waverly Ferrari, MD  ASSISTANT:  Montez Morita PA-C for panel #1.  ANESTHESIA:  General.  COMPLICATIONS:  None.  ESTIMATED BLOOD LOSS:  For panel #1 was 50 mL.  DISPOSITION:  He remained in the operating room for Dr. Edilia Bo to perform a femoral artery repair.  BRIEF SUMMARY AND INDICATIONS FOR PROCEDURE:  The patient is a 32 year old male who fell over 30 feet from a tree hitting branches on the way down and sustaining a comminuted left femoral shaft fracture.  He was found to have a pulseless leg and  underwent a CT angiography, which diagnosed disruption of the superficial femoral artery.  He was initially seen and evaluated by the general surgery trauma service who cleared him for surgery.  He was then evaluated by both orthopedics and vascular.  I  did coordinate care with Dr. Cliffton Asters and Dr. Edilia Bo from the vascular service with a decision made to proceed emergently for femoral nailing to stabilize the bone followed by exploration and repair of the femoral artery by Dr. Edilia Bo.  I discussed with  the patient preoperatively the risks and benefits of surgery including the potential for nonunion, malunion, symptomatic hardware, blood loss requiring transfusion, DVT, PE, infection, neurovascular injury, failure  of his repair and need for further  surgery, among others.  After full discussion, the patient provided consent to continue.  SUMMARY OF PROCEDURE:  The patient was given preoperative antibiotics, taken to the operating room where general anesthesia was induced.  His lower extremities were prepped and draped in usual sterile fashion leaving both legs free for anticipated vein  harvest.  We began on the right side after a timeout making a 3 cm incision along the midline of the patellar tendon and then a medial parapatellar retinacular incision with insertion of the threaded guidewire.  This was advanced in a center-center  position of the distal femur just anterior to Blumensaat's line on the lateral and centrally on the AP.  This was followed by the starting reamer and the introduction of the ball-tipped guidewire.  While my assistant held traction and derotated the  fracture into appropriate alignment, I applied direct force at the fracture site and was able to advance a ball-tipped guidewire across the comminuted fracture and into the proximal femur.  There was some sense that his bone was soft at that point.  We  measured to 400 mm and then sequentially reamed while holding the fracture in appropriate alignment going up to 10.5 mm and placing a 9 mm diameter nail x 400.  Two lateral to medial distal locks were placed and engaged with the entry nail.  A  telescoping locking device and then used perfect circle technique to place two screws proximally.  Again noted there was the diminished bone quality, particularly for young man of that age.  Wounds were irrigated thoroughly.  Final x-ray  showed  appropriate reduction, hardware placement, trajectory and length.  Montez Morita PA-C did assist me throughout.  Assistant was necessary for the expeditious and appropriate care of this patient and was required to obtain a reduction and initial  instrumentation.  He did hold throughout until both proximal and distal  locking bolts were placed and was able to assist with simultaneous wound closure to advance toward vessel repair.  Dr. Edilia Bo was present in the room during much of the procedure  and was able to start immediately at its conclusion with his vessel repair.  Please refer to his note for a complete account.  PROGNOSIS:  The patient is at elevated risk for compartment syndrome.  His compartments remained quite soft and time from injury to repair somewhere between four and five hours as he presented to the Emergency Department at 11:00 a.m. and it is around 3  o'clock when Dr. Edilia Bo began his exploration.  Dr. Edilia Bo is prepared to perform prophylactic fasciotomies if any suggestion.  With regard to his orthopedic injury, I have no range of motion restrictions of either his hip or knee, but will be  touchdown weightbearing because of the comminution and poor bone quality.  We will likely advance him early in the postoperative course and at a minimum will be fully weightbearing.  We release to tolerance by six weeks.  He will be on pharmacologic DVT  prophylaxis and a metabolic bone workup is already underway.  TN/NUANCE  D:09/12/2017 T:09/12/2017 JOB:[id number]

## 2017-09-12 NOTE — ED Notes (Signed)
Report given to or nurse

## 2017-09-12 NOTE — ED Notes (Signed)
Family at bedside. Wife and mother. Given wallet, watch, and clothes to wife.

## 2017-09-13 ENCOUNTER — Encounter (HOSPITAL_COMMUNITY): Payer: Self-pay | Admitting: Orthopedic Surgery

## 2017-09-13 DIAGNOSIS — S7292XA Unspecified fracture of left femur, initial encounter for closed fracture: Secondary | ICD-10-CM

## 2017-09-13 DIAGNOSIS — R937 Abnormal findings on diagnostic imaging of other parts of musculoskeletal system: Secondary | ICD-10-CM | POA: Diagnosis not present

## 2017-09-13 DIAGNOSIS — W14XXXA Fall from tree, initial encounter: Secondary | ICD-10-CM | POA: Diagnosis present

## 2017-09-13 HISTORY — DX: Unspecified fracture of left femur, initial encounter for closed fracture: S72.92XA

## 2017-09-13 LAB — CBC
HCT: 30 % — ABNORMAL LOW (ref 39.0–52.0)
Hemoglobin: 10.2 g/dL — ABNORMAL LOW (ref 13.0–17.0)
MCH: 31 pg (ref 26.0–34.0)
MCHC: 34 g/dL (ref 30.0–36.0)
MCV: 91.2 fL (ref 78.0–100.0)
PLATELETS: 183 10*3/uL (ref 150–400)
RBC: 3.29 MIL/uL — ABNORMAL LOW (ref 4.22–5.81)
RDW: 12.5 % (ref 11.5–15.5)
WBC: 12.9 10*3/uL — AB (ref 4.0–10.5)

## 2017-09-13 LAB — COMPREHENSIVE METABOLIC PANEL
ALBUMIN: 3.2 g/dL — AB (ref 3.5–5.0)
ALT: 36 U/L (ref 0–44)
ANION GAP: 8 (ref 5–15)
AST: 38 U/L (ref 15–41)
Alkaline Phosphatase: 33 U/L — ABNORMAL LOW (ref 38–126)
BILIRUBIN TOTAL: 1.3 mg/dL — AB (ref 0.3–1.2)
BUN: 9 mg/dL (ref 6–20)
CHLORIDE: 105 mmol/L (ref 98–111)
CO2: 24 mmol/L (ref 22–32)
Calcium: 7.6 mg/dL — ABNORMAL LOW (ref 8.9–10.3)
Creatinine, Ser: 0.9 mg/dL (ref 0.61–1.24)
GFR calc Af Amer: >60 mL/min (ref >60)
GFR calc non Af Amer: >60 mL/min (ref >60)
GLUCOSE: 142 mg/dL — AB (ref 70–99)
POTASSIUM: 3.7 mmol/L (ref 3.5–5.1)
SODIUM: 137 mmol/L (ref 135–145)
TOTAL PROTEIN: 5.1 g/dL — AB (ref 6.5–8.1)

## 2017-09-13 LAB — PREALBUMIN: PREALBUMIN: 22.8 mg/dL (ref 18–38)

## 2017-09-13 LAB — MAGNESIUM: Magnesium: 2 mg/dL (ref 1.7–2.4)

## 2017-09-13 LAB — HEMOGLOBIN A1C
Hgb A1c MFr Bld: 5.2 % (ref 4.8–5.6)
Mean Plasma Glucose: 102.54 mg/dL

## 2017-09-13 LAB — RAPID URINE DRUG SCREEN, HOSP PERFORMED
AMPHETAMINES: NOT DETECTED
BENZODIAZEPINES: POSITIVE — AB
Barbiturates: NOT DETECTED
Cocaine: NOT DETECTED
Opiates: NOT DETECTED
TETRAHYDROCANNABINOL: NOT DETECTED

## 2017-09-13 LAB — PHOSPHORUS: Phosphorus: 3 mg/dL (ref 2.5–4.6)

## 2017-09-13 LAB — TSH: TSH: 1.138 u[IU]/mL (ref 0.350–4.500)

## 2017-09-13 LAB — CDS SEROLOGY

## 2017-09-13 MED ORDER — ASCORBIC ACID 500 MG PO TABS: 500.0000 mg | ORAL_TABLET | Freq: Every day | ORAL | 0 refills | Status: DC

## 2017-09-13 MED ORDER — MORPHINE SULFATE (PF) 2 MG/ML IV SOLN
2.0000 mg | INTRAVENOUS | Status: DC | PRN
  Administered 2017-09-13 – 2017-09-14 (×3): 2 mg via INTRAVENOUS
  Filled 2017-09-13 (×3): qty 1

## 2017-09-13 MED ORDER — OXYCODONE-ACETAMINOPHEN 5-325 MG PO TABS
1.0000 | ORAL_TABLET | Freq: Three times a day (TID) | ORAL | Status: DC | PRN
  Administered 2017-09-13: 1 via ORAL
  Administered 2017-09-14 – 2017-09-15 (×3): 2 via ORAL
  Filled 2017-09-13 (×3): qty 2
  Filled 2017-09-13: qty 1
  Filled 2017-09-13: qty 2

## 2017-09-13 MED ORDER — ENOXAPARIN (LOVENOX) PATIENT EDUCATION KIT: 1.0000 | PACK | Freq: Once | 0 refills | Status: AC

## 2017-09-13 MED ORDER — CALCIUM CITRATE 950 (200 CA) MG PO TABS
200.0000 mg | ORAL_TABLET | Freq: Two times a day (BID) | ORAL | Status: DC
  Administered 2017-09-13 – 2017-09-15 (×5): 200 mg via ORAL
  Filled 2017-09-13 (×5): qty 1

## 2017-09-13 MED ORDER — CALCIUM CITRATE 950 (200 CA) MG PO TABS: 200.0000 mg | ORAL_TABLET | Freq: Two times a day (BID) | ORAL | 1 refills | Status: DC

## 2017-09-13 MED ORDER — METHOCARBAMOL 750 MG PO TABS: 750.0000 mg | ORAL_TABLET | Freq: Three times a day (TID) | ORAL | 0 refills | Status: DC | PRN

## 2017-09-13 MED ORDER — ENOXAPARIN SODIUM 40 MG/0.4ML ~~LOC~~ SOLN: 40.0000 mg | SUBCUTANEOUS | 0 refills | Status: DC

## 2017-09-13 MED ORDER — ENOXAPARIN (LOVENOX) PATIENT EDUCATION KIT
PACK | Freq: Once | Status: AC
  Administered 2017-09-13: 15:00:00
  Filled 2017-09-13: qty 1

## 2017-09-13 MED ORDER — VITAMIN D3 125 MCG (5000 UT) PO TABS: 5000.0000 [IU] | ORAL_TABLET | Freq: Every day | ORAL | 2 refills | Status: DC

## 2017-09-13 MED ORDER — VITAMIN C 500 MG PO TABS
500.0000 mg | ORAL_TABLET | Freq: Every day | ORAL | Status: DC
  Administered 2017-09-13 – 2017-09-15 (×3): 500 mg via ORAL
  Filled 2017-09-13 (×3): qty 1

## 2017-09-13 MED ORDER — ACETAMINOPHEN 500 MG PO TABS: 500.0000 mg | ORAL_TABLET | Freq: Three times a day (TID) | ORAL | 0 refills | Status: DC

## 2017-09-13 MED ORDER — DOCUSATE SODIUM 100 MG PO CAPS: 100.0000 mg | ORAL_CAPSULE | Freq: Every day | ORAL | 0 refills | Status: DC

## 2017-09-13 MED ORDER — AMLODIPINE BESYLATE 5 MG PO TABS
2.5000 mg | ORAL_TABLET | Freq: Every day | ORAL | Status: DC
  Administered 2017-09-13 – 2017-09-15 (×3): 2.5 mg via ORAL
  Filled 2017-09-13 (×3): qty 1

## 2017-09-13 MED ORDER — OXYCODONE-ACETAMINOPHEN 5-325 MG PO TABS: 1.0000 | ORAL_TABLET | Freq: Three times a day (TID) | ORAL | 0 refills | Status: DC | PRN

## 2017-09-13 MED ORDER — OXYCODONE HCL 5 MG PO TABS: 5.0000 mg | ORAL_TABLET | Freq: Three times a day (TID) | ORAL | 0 refills | Status: DC | PRN

## 2017-09-13 MED ORDER — VITAMIN D 1000 UNITS PO TABS
2000.0000 [IU] | ORAL_TABLET | Freq: Two times a day (BID) | ORAL | Status: DC
  Administered 2017-09-13 – 2017-09-15 (×5): 2000 [IU] via ORAL
  Filled 2017-09-13 (×5): qty 2

## 2017-09-13 NOTE — Evaluation (Addendum)
Physical Therapy Evaluation Patient Details Name: Clinton Kelley MRN: [id number] DOB: 11-04-1985 Today's Date: 09/13/2017   History of Present Illness  Clinton Kelley is a 32 y.o. male who fell 30 feet out of a tree.  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  Also T9 compression fracture.  After Dr. Carola Frost fix his broken femur and Dr. Edilia Bo repaired the artery.  IM nail left femur and repair of left femoral artery rupture.  PMH: back surgery  Clinical Impression  Pt admitted with above diagnosis. Pt currently with functional limitations due to the deficits listed below (see PT Problem List). Pt was able to stand to RW with mod assist of 2 with one person ensuring that pt was maintaining weight bearing and the other providing assist to power up.  Able to perform stand pivot transfer with +2 min assist to chair.  Should progress well with therapy.  Will follow acutely. Pt will benefit from skilled PT to increase their independence and safety with mobility to allow discharge to the venue listed below.      Follow Up Recommendations Home health PT;Supervision/Assistance - 24 hour    Equipment Recommendations  Rolling walker with 5" wheels;3in1 (PT)    Recommendations for Other Services       Precautions / Restrictions Precautions Precautions: Fall Precaution Comments: JP drain left LE Restrictions Weight Bearing Restrictions: Yes LLE Weight Bearing: Touchdown weight bearing      Mobility  Bed Mobility Overal bed mobility: Needs Assistance Bed Mobility: Sit to Supine       Sit to supine: Max assist;+2 for physical assistance   General bed mobility comments: Needed incr assist for moving and holding left LE with pt using UEs to move back into bed.  Transfers Overall transfer level: Needs assistance Equipment used: Rolling walker (2 wheeled) Transfers: Sit to/from [employer] Sit to Stand: +2 safety/equipment;Min assist         General transfer comment: Pt able to power up  from chair with min assist with power up with pt pushing up from chair.  Assist and cues to maintain TDWB on left LE.    Ambulation/Gait Ambulation/Gait assistance: Min assist;+2 safety/equipment Gait Distance (Feet): 25 Feet Assistive device: Rolling walker (2 wheeled) Gait Pattern/deviations: Step-to pattern;Decreased step length - left;Decreased weight shift to left;Antalgic   Gait velocity interpretation: <1.31 ft/sec, indicative of household ambulator General Gait Details: Pt able to take steps with cues for TDWB left LE with RW with min assist for balance.  Overall progressing well.  Pt does have incr pain but is progressing.   Stairs            Wheelchair Mobility    Modified Rankin (Stroke Patients Only)       Balance Overall balance assessment: Needs assistance Sitting-balance support: No upper extremity supported;Feet supported Sitting balance-Leahy Scale: Fair Sitting balance - Comments: Min guard assist to sit EOB    Standing balance support: Bilateral upper extremity supported;During functional activity Standing balance-Leahy Scale: Poor Standing balance comment: Needed min assist to  maintain standing with heavey reliance on UEs for support                             Pertinent Vitals/Pain Pain Assessment: Faces Faces Pain Scale: Hurts worst Pain Location: left LE with movement up to 10, 3 at  rest Pain Descriptors / Indicators: Aching;Grimacing;Guarding;Operative site guarding Pain Intervention(s): Limited activity within patient's tolerance;Monitored during session;Repositioned;Patient requesting pain meds-RN notified;Ice applied    Home Living                        Prior Function                 Hand Dominance        Extremity/Trunk Assessment                Communication      Cognition  Arousal/Alertness: Awake/alert Behavior During Therapy: WFL for tasks assessed/performed Overall Cognitive Status: Within Functional Limits for tasks assessed                                        General Comments General comments (skin integrity, edema, etc.): Pt reports that numbness was on anterior lower leg and that currently incr numbness to foot and heel as well as medial and lateral LE.  Called Vascular oncall MD Dr. Edilia Bo who will come and check on pt.      Exercises General Exercises - Lower Extremity Quad Sets: AROM;Both;10 reps;Supine   Assessment/Plan    PT Assessment    PT Problem List         PT Treatment Interventions      PT Goals (Current goals can be found in the Care Plan section)  Acute Rehab PT Goals Patient Stated Goal: to go home and get around on my own    Frequency Min 6X/week   Barriers to discharge        Co-evaluation               AM-PAC PT "6 Clicks" Daily Activity  Outcome Measure Difficulty turning over in bed (including adjusting bedclothes, sheets and blankets)?: Unable Difficulty moving from lying on back to sitting on the side of the bed? : Unable Difficulty sitting down on and standing up from a chair with arms (e.g., wheelchair, bedside commode, etc,.)?: Unable Help needed moving to and from a bed to chair (including a wheelchair)?: A Little Help needed walking in hospital room?: A Little Help needed climbing 3-5 steps with a railing? : A Lot 6 Click Score: 11    End of Session Equipment Utilized During Treatment: Gait belt Activity Tolerance: Patient limited by fatigue;Patient limited by pain Patient left: with call bell/phone within reach;with family/visitor present;in bed Nurse Communication: Mobility status;Patient requests pain meds;Weight bearing status PT Visit Diagnosis: Unsteadiness on feet (R26.81);Muscle weakness (generalized) (M62.81);Pain;Difficulty in walking, not elsewhere classified  (R26.2) Pain - Right/Left: Left Pain - part of body: Leg;Hip;Knee    Time: 1212-1236 PT Time Calculation (min) (ACUTE ONLY): 24 min   Charges:     PT Treatments $Gait Training: 8-22 mins $Therapeutic Exercise: 8-22 mins        Clinton Kelley,PT Acute Rehabilitation Services Pager:  [phone]  Office:  [phone]    Berline Lopes 09/13/2017, 1:11 PM

## 2017-09-13 NOTE — Care Management Note (Signed)
Case Management Note  Patient Details  Name: Clinton Kelley MRN: [id number] Date of Birth: May 25, 1985  Subjective/Objective:   32 yr old gentleman s/p IM nailing of left femur fracture.                  Action/Plan: Case manager spoke with patient and wife concerning discharge plan. Case manager also spoke with [name] concerning patient's need for Home Health. Per [name] patient will do fine at discharge following exercises shown, no HH needed. Case manager explained this to patient. He will have family support at discharge.    Expected Discharge Date:   09/14/17               Expected Discharge Plan:  Home/Self Care  In-House Referral:  NA  Discharge planning Services  CM Consult  Post Acute Care Choice:  Durable Medical Equipment Choice offered to:  Spouse  DME Arranged:  3-N-1, Walker rolling DME Agency:  [facility]  HH Arranged:  NA HH Agency:  NA  Status of Service:  Completed, signed off  If discussed at Long Length of Stay Meetings, dates discussed:    Additional Comments:  Durenda Guthrie, RN 09/13/2017, 12:35 PM

## 2017-09-13 NOTE — Progress Notes (Signed)
1 Day Post-Op   Subjective/Chief Complaint: Denies any neck/ chest/ abdominal/ other extremity pains.    Objective: Vital signs in last 24 hours: Temp:  [97.1 F (36.2 C)-98.6 F (37 C)] 98.6 F (37 C) (09/07 0514) Pulse Rate:  [74-101] 82 (09/07 0514) Resp:  [11-20] 18 (09/07 0514) BP: (124-180)/(71-98) 124/71 (09/07 0514) SpO2:  [88 %-100 %] 98 % (09/07 0514) Arterial Line BP: (167-196)/(74-89) 167/77 (09/06 1925) Weight:  [81.6 kg] 81.6 kg (09/06 1124)    Intake/Output from previous day: 09/06 0701 - 09/07 0700 In: 2850 [I.V.:2850] Out: 2840 [Urine:2365; Drains:75; Blood:400] Intake/Output this shift: Total I/O In: -  Out: 750 [Urine:750]  General appearance: alert and cooperative Neck: supple, symmetrical, trachea midline and no cspine tenderness Resp: clear to auscultation bilaterally Chest wall: no tenderness Cardio: regular rate and rhythm GI: soft, non-tender; bowel sounds normal; no masses,  no organomegaly Extremities: LLE drain with serosanguinous output Pulses: palpable left DP pulse Skin: Skin color, texture, turgor normal. No rashes or lesions Neurologic: Grossly normal  Lab Results:  Recent Labs    09/12/17 1126  09/12/17 1423 09/13/17 0500  WBC 14.5*  --   --  12.9*  HGB 14.5   < > 11.6* 10.2*  HCT 43.1   < > 34.0* 30.0*  PLT 225  --   --  183   < > = values in this interval not displayed.   BMET Recent Labs    09/12/17 1126 09/12/17 1133 09/12/17 1423 09/13/17 0500  NA 142 140 139 137  K 3.5 3.3* 3.4* 3.7  CL 108 106  --  105  CO2 24  --   --  24  GLUCOSE 128* 128*  --  142*  BUN 15 19  --  9  CREATININE 1.12 1.00  --  0.90  CALCIUM 9.1  --   --  7.6*   PT/INR Recent Labs    09/12/17 1126  LABPROT 13.0  INR 0.99   ABG Recent Labs    09/12/17 1423  PHART 7.348*  HCO3 24.4    Studies/Results: Ct Head Wo Contrast  Result Date: 09/12/2017 CLINICAL DATA:  Patient fell from 50 feet. EXAM: CT HEAD WITHOUT CONTRAST CT  CERVICAL SPINE WITHOUT CONTRAST TECHNIQUE: Multidetector CT imaging of the head and cervical spine was performed following the standard protocol without intravenous contrast. Multiplanar CT image reconstructions of the cervical spine were also generated. COMPARISON:  None. FINDINGS: CT HEAD FINDINGS Brain: No evidence of acute infarction, hemorrhage, hydrocephalus, extra-axial collection or mass lesion/mass effect. Vascular: No hyperdense vessel or unexpected calcification. Skull: Normal. Negative for fracture or focal lesion. Sinuses/Orbits: Globes and orbits are unremarkable. Sinuses and mastoid air cells are clear. Other: None. CT CERVICAL SPINE FINDINGS Alignment: Normal. Skull base and vertebrae: No acute fracture. No primary bone lesion or focal pathologic process. Soft tissues and spinal canal: No prevertebral fluid or swelling. No visible canal hematoma. Disc levels: Discs are well maintained in height. No evidence of a disc herniation. Central spinal canal and neural foramina are well preserved. Upper chest: Unremarkable. No masses or adenopathy. Clear lung apices. Other: None. IMPRESSION: HEAD CT 1. No intracranial abnormality. 2. No skull fracture. CERVICAL CT 1. Normal. Electronically Signed   By: Amie Portland M.D.   On: 09/12/2017 12:52   Ct Chest W Contrast  Result Date: 09/12/2017 CLINICAL DATA:  32 year old male with acute chest, abdominal and pelvic pain following 30 foot fall today. Initial encounter. EXAM: CT CHEST, ABDOMEN, AND PELVIS  WITH CONTRAST TECHNIQUE: Multidetector CT imaging of the chest, abdomen and pelvis was performed following the standard protocol during bolus administration of intravenous contrast. CONTRAST:  ISOVUE-370 IOPAMIDOL (ISOVUE-370) INJECTION 76% COMPARISON:  None. FINDINGS: CT CHEST FINDINGS Cardiovascular: No significant vascular findings. Normal heart size. No pericardial effusion. Mediastinum/Nodes: No enlarged mediastinal, hilar, or axillary lymph nodes.  Thyroid gland, trachea, and esophagus demonstrate no significant findings. Lungs/Pleura: Minimal ground-glass opacity within the posteromedial RIGHT LOWER lobe noted and may represent mild contusion. No airspace disease, consolidation, suspicious nodule, mass, pleural effusion or pneumothorax. Musculoskeletal: Minimal irregularity of the anterior LEFT 3rd, 4th and 5th ribs may represent nondisplaced fractures of uncertain chronicity. A remote appearing compression fracture of T9 is noted. CT ABDOMEN PELVIS FINDINGS Hepatobiliary: The liver and gallbladder are unremarkable. No biliary dilatation. Pancreas: Unremarkable Spleen: Unremarkable Adrenals/Urinary Tract: Kidneys, adrenal glands and bladder are unremarkable. Stomach/Bowel: Stomach is within normal limits. Appendix appears normal. No evidence of bowel wall thickening, distention, or inflammatory changes. Vascular/Lymphatic: No significant vascular findings are present. No enlarged abdominal or pelvic lymph nodes. Reproductive: Prostate is unremarkable. Other: No ascites, pneumoperitoneum, focal collection or hematoma. Musculoskeletal: No acute or significant osseous findings. IMPRESSION: 1. Minimal irregularity of the anterior LEFT 3rd, 4th and 5th ribs which may represent fractures of uncertain chronicity. 2. Ground-glass opacity within the posteromedial RIGHT LOWER lobe which may represent mild contusion. 3. No acute abnormalities within the abdomen or pelvis. 4. Remote T9 compression fracture Electronically Signed   By: Harmon Pier M.D.   On: 09/12/2017 13:06   Ct Cervical Spine Wo Contrast  Result Date: 09/12/2017 CLINICAL DATA:  Patient fell from 50 feet. EXAM: CT HEAD WITHOUT CONTRAST CT CERVICAL SPINE WITHOUT CONTRAST TECHNIQUE: Multidetector CT imaging of the head and cervical spine was performed following the standard protocol without intravenous contrast. Multiplanar CT image reconstructions of the cervical spine were also generated. COMPARISON:   None. FINDINGS: CT HEAD FINDINGS Brain: No evidence of acute infarction, hemorrhage, hydrocephalus, extra-axial collection or mass lesion/mass effect. Vascular: No hyperdense vessel or unexpected calcification. Skull: Normal. Negative for fracture or focal lesion. Sinuses/Orbits: Globes and orbits are unremarkable. Sinuses and mastoid air cells are clear. Other: None. CT CERVICAL SPINE FINDINGS Alignment: Normal. Skull base and vertebrae: No acute fracture. No primary bone lesion or focal pathologic process. Soft tissues and spinal canal: No prevertebral fluid or swelling. No visible canal hematoma. Disc levels: Discs are well maintained in height. No evidence of a disc herniation. Central spinal canal and neural foramina are well preserved. Upper chest: Unremarkable. No masses or adenopathy. Clear lung apices. Other: None. IMPRESSION: HEAD CT 1. No intracranial abnormality. 2. No skull fracture. CERVICAL CT 1. Normal. Electronically Signed   By: Amie Portland M.D.   On: 09/12/2017 12:52   Ct Abdomen Pelvis W Contrast  Result Date: 09/12/2017 CLINICAL DATA:  32 year old male with acute chest, abdominal and pelvic pain following 30 foot fall today. Initial encounter. EXAM: CT CHEST, ABDOMEN, AND PELVIS WITH CONTRAST TECHNIQUE: Multidetector CT imaging of the chest, abdomen and pelvis was performed following the standard protocol during bolus administration of intravenous contrast. CONTRAST:  ISOVUE-370 IOPAMIDOL (ISOVUE-370) INJECTION 76% COMPARISON:  None. FINDINGS: CT CHEST FINDINGS Cardiovascular: No significant vascular findings. Normal heart size. No pericardial effusion. Mediastinum/Nodes: No enlarged mediastinal, hilar, or axillary lymph nodes. Thyroid gland, trachea, and esophagus demonstrate no significant findings. Lungs/Pleura: Minimal ground-glass opacity within the posteromedial RIGHT LOWER lobe noted and may represent mild contusion. No airspace disease, consolidation, suspicious  nodule, mass,  pleural effusion or pneumothorax. Musculoskeletal: Minimal irregularity of the anterior LEFT 3rd, 4th and 5th ribs may represent nondisplaced fractures of uncertain chronicity. A remote appearing compression fracture of T9 is noted. CT ABDOMEN PELVIS FINDINGS Hepatobiliary: The liver and gallbladder are unremarkable. No biliary dilatation. Pancreas: Unremarkable Spleen: Unremarkable Adrenals/Urinary Tract: Kidneys, adrenal glands and bladder are unremarkable. Stomach/Bowel: Stomach is within normal limits. Appendix appears normal. No evidence of bowel wall thickening, distention, or inflammatory changes. Vascular/Lymphatic: No significant vascular findings are present. No enlarged abdominal or pelvic lymph nodes. Reproductive: Prostate is unremarkable. Other: No ascites, pneumoperitoneum, focal collection or hematoma. Musculoskeletal: No acute or significant osseous findings. IMPRESSION: 1. Minimal irregularity of the anterior LEFT 3rd, 4th and 5th ribs which may represent fractures of uncertain chronicity. 2. Ground-glass opacity within the posteromedial RIGHT LOWER lobe which may represent mild contusion. 3. No acute abnormalities within the abdomen or pelvis. 4. Remote T9 compression fracture Electronically Signed   By: Harmon Pier M.D.   On: 09/12/2017 13:06   Ct Angio Ao+bifem W & Or Wo Contrast  Result Date: 09/12/2017 CLINICAL DATA:  Fell 50 feet, femur fracture, decreased dorsalis pedis pulse EXAM: CT ANGIOGRAPHY OF ABDOMINAL AORTA WITH ILIOFEMORAL RUNOFF TECHNIQUE: Multidetector CT imaging of the abdomen, pelvis and lower extremities was performed using the standard protocol during bolus administration of intravenous contrast. Multiplanar CT image reconstructions and MIPs were obtained to evaluate the vascular anatomy. CONTRAST:  ISOVUE-370 IOPAMIDOL (ISOVUE-370) INJECTION 76% COMPARISON:  None. FINDINGS: VASCULAR Aorta: Normal caliber aorta without aneurysm, dissection, vasculitis or significant  stenosis. Celiac: Short-segment narrowing just beyond the origin presumably related to the median arcuate ligament of the diaphragm, widely patent distally. SMA: Patent without evidence of aneurysm, dissection, vasculitis or significant stenosis. Some image degradation secondary to motion. Renals: Both renal arteries are patent without evidence of aneurysm, dissection, vasculitis, fibromuscular dysplasia or significant stenosis. IMA: Patent without evidence of aneurysm, dissection, vasculitis or significant stenosis. RIGHT Lower Extremity Inflow: Common, internal and external iliac arteries are patent without evidence of aneurysm, dissection, vasculitis or significant stenosis. Outflow: Common, superficial and profunda femoral arteries and the popliteal artery are patent without evidence of aneurysm, dissection, vasculitis or significant stenosis. Runoff: Patent three vessel runoff to the ankle. LEFT Lower Extremity Inflow: Common, internal and external iliac arteries are patent without evidence of aneurysm, dissection, vasculitis or significant stenosis. Outflow: Short-segment occlusion in the distal SFA at the level of the adductor hiatus extending over length of less than 2 cm, vessel unremarkable proximally and distally. No extravasation or pseudoaneurysm. Common femoral artery, deep femoral branches, proximal SFA, and popliteal artery are widely patent and otherwise unremarkable. Runoff: Patent three vessel runoff to the ankle. Posterior tibial artery patent across the ankle. Distal anterior tibial artery is diminutive. The dorsalis pedis is not visualized may be secondary to relatively diminutive size versus embolus. Veins: No obvious venous abnormality within the limitations of this arterial phase study. Review of the MIP images confirms the above findings. NON-VASCULAR See current CTA abdomen pelvis report dictated separately Musculoskeletal: Comminuted midshaft left femur fracture with greater than shaft  with posterior displacement of distal fracture fragment. IMPRESSION: VASCULAR 1. Short-segment occlusion of the distal left SFA at the adductor hiatus, involving a length of less than 2 cm, probably related to intimal injury given recent trauma. No active extravasation or pseudoaneurysm. 2. Diminutive left anterior tibial artery with nonvisualization of dorsalis pedis. 3. No other significant vascular findings. NON-VASCULAR 1. Comminuted displaced midshaft left  femur fracture. Electronically Signed   By: Corlis Leak M.D.   On: 09/12/2017 13:19   Dg Pelvis Portable  Result Date: 09/12/2017 CLINICAL DATA:  Pt fell 30 feet from a tree, pains lower lumbar, left femur, left femur deformity., no chest complaints, hx htn, EXAM: PORTABLE PELVIS 1-2 VIEWS COMPARISON:  None. FINDINGS: There is no evidence of pelvic fracture or diastasis. No pelvic bone lesions are seen. IMPRESSION: Negative. Electronically Signed   By: Amie Portland M.D.   On: 09/12/2017 11:52   Dg Chest Port 1 View  Result Date: 09/12/2017 CLINICAL DATA:  Pt fell 30 feet from a tree, pains lower lumbar, left femur, left femur deformity., no chest complaints, hx htn, EXAM: PORTABLE CHEST 1 VIEW COMPARISON:  None. FINDINGS: Normal heart, mediastinum and hila. Clear lungs. No pleural effusion or pneumothorax. Skeletal structures are intact. IMPRESSION: No active disease. Electronically Signed   By: Amie Portland M.D.   On: 09/12/2017 11:51   Dg C-arm 1-60 Min  Result Date: 09/12/2017 CLINICAL DATA:  Imaging from ORIF of a left femur fracture. EXAM: DG C-ARM 61-120 MIN; LEFT FEMUR 2 VIEWS COMPARISON:  Preop imaging obtained earlier today. FINDINGS: Images show placement of an intramedullary rod from the trochanteric region of the left proximal femur to the distal metaphysis. The comminuted displaced fracture of the midshaft has been reduced. Primary fracture components are in near anatomic alignment. The orthopedic hardware appears well seated.  IMPRESSION: Well aligned primary fracture components of the midshaft left femur fracture following ORIF. Electronically Signed   By: Amie Portland M.D.   On: 09/12/2017 15:52   Dg Femur Min 2 Views Left  Result Date: 09/12/2017 CLINICAL DATA:  Imaging from ORIF of a left femur fracture. EXAM: DG C-ARM 61-120 MIN; LEFT FEMUR 2 VIEWS COMPARISON:  Preop imaging obtained earlier today. FINDINGS: Images show placement of an intramedullary rod from the trochanteric region of the left proximal femur to the distal metaphysis. The comminuted displaced fracture of the midshaft has been reduced. Primary fracture components are in near anatomic alignment. The orthopedic hardware appears well seated. IMPRESSION: Well aligned primary fracture components of the midshaft left femur fracture following ORIF. Electronically Signed   By: Amie Portland M.D.   On: 09/12/2017 15:52   Dg Femur Port Min 2 Views Left  Result Date: 09/12/2017 CLINICAL DATA:  Postoperative EXAM: LEFT FEMUR PORTABLE 2 VIEWS COMPARISON:  09/12/2017 FINDINGS: Left femoral head projects in joint. Interval intramedullary rod and distal screw fixation of comminuted midshaft fracture with decreased displacement as compared with preoperative image. 3.8 cm dominant displaced fracture fragment anteriorly. Multiple additional displaced bone fragments are noted. There is a drain in the soft tissues. There is gas within the soft tissues at the knee. IMPRESSION: Interval surgical fixation of comminuted midshaft femoral fracture. Electronically Signed   By: Jasmine Pang M.D.   On: 09/12/2017 19:20   Dg Femur Portable Min 2 Views Left  Result Date: 09/12/2017 CLINICAL DATA:  Pt fell 30 feet from a tree, pains lower lumbar, left femur, left femur deformity., no chest complaints, hx htn, EXAM: LEFT FEMUR PORTABLE 2 VIEWS COMPARISON:  None. FINDINGS: Comminuted fracture of the midshaft of the left femur. There is fracture displacement. The primary distal fracture  component has displaced posteriorly in relation to the proximal component by 5 cm. It has displaced laterally by 1.8 cm and is overlap/foreshortened by approximately 3 cm. There are intervening comminuted fracture components, the largest of which measures 4.6 cm in  length. There is also mild medial angulation of the distal component in relation to the proximal component of approximately 13 degrees. No other fractures.  Hip and knee joints are normally aligned. There is surrounding soft tissue swelling. IMPRESSION: 1. Comminuted, displaced and mildly angulated fracture of the midshaft of the left femur as detailed above. Electronically Signed   By: Amie Portland M.D.   On: 09/12/2017 11:55    Anti-infectives: Anti-infectives (From admission, onward)   Start     Dose/Rate Route Frequency Ordered Stop   09/12/17 2200  ceFAZolin (ANCEF) IVPB 2g/100 mL premix  Status:  Discontinued     2 g 200 mL/hr over 30 Minutes Intravenous Every 8 hours 09/12/17 2013 09/12/17 2039   09/12/17 2015  ceFAZolin (ANCEF) IVPB 2g/100 mL premix     2 g 200 mL/hr over 30 Minutes Intravenous Every 8 hours 09/12/17 2013 09/13/17 0446      Assessment/Plan: s/p Procedure(s): INTRAMEDULLARY (IM) [place] RETROGRADE FEMORAL NAILING (Left) EXPLORATION OF LEFT SUPERFICIAL FEMORAL ARTERY, REMOVAL OF DAMAGED SEGMENT  WITH INTERPOSITION VEIN GRAFTING FROM LEFT LEG (Left) Fall from tree 09/12/17. no other traumatic injuries. trauma will sign off. please call if we can be of help  LOS: 1 day    Clinton Kelley 09/13/2017

## 2017-09-13 NOTE — Progress Notes (Addendum)
09/13/17 1200  PT Visit Information  Last PT Received On 09/13/17  Assistance Needed +2  History of Present Illness Clinton Kelley is a 32 y.o. male who fell 30 feet out of a tree.  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  Also T9 compression fracture. After Dr. Carola Frost fix his broken femur and Dr. Edilia Bo repaired the artery.  IM nail left femur and repair of left femoral artery rupture.  PMH: back surgery  Subjective Data  Patient Stated Goal to go home and get around on my own  Precautions  Precautions Fall  Precaution Comments JP drain left LE  Restrictions  Weight Bearing Restrictions Yes  LLE Weight Bearing TWB  Pain Assessment  Pain Assessment Faces  Faces Pain Scale 10  Pain Location left LE with movement up to 10, 3 at rest  Pain Descriptors / Indicators Aching;Grimacing;Guarding;Operative site guarding  Pain Intervention(s) Limited activity within patient's tolerance;Monitored during session;Repositioned;Patient requesting pain meds-RN notified;Ice applied  Cognition  Arousal/Alertness Awake/alert  Behavior During Therapy WFL for tasks assessed/performed  Overall Cognitive Status Within Functional Limits for tasks assessed  Bed Mobility  Overal bed mobility Needs Assistance  Bed Mobility Sit to Supine  Sit to supine Max assist;+2 for physical assistance  General bed mobility comments Needed incr assist for moving and holding left LE with pt using UEs to move back into bed.  Transfers  Overall transfer level Needs assistance  Equipment used Rolling walker (2 wheeled)  Transfers Sit to/from [employer] Transfers  Sit to Stand +2 safety/equipment;Min assist  General transfer comment Pt able to power up  from chair with min assist with power up with pt pushing up from chair.  Assist and cues to maintain TDWB on left LE.    Ambulation/Gait  Ambulation/Gait assistance Min  assist;+2 safety/equipment  Gait Distance (Feet) 25 Feet  Assistive device Rolling walker (2 wheeled)  Gait Pattern/deviations Step-to pattern;Decreased step length - left;Decreased weight shift to left;Antalgic  General Gait Details Pt able to take steps with cues for TDWB left LE with RW with min assist for balance.  Overall progressing well.  Pt does have incr pain but is progressing.   Gait velocity interpretation <1.31 ft/sec, indicative of household ambulator  Balance  Overall balance assessment Needs assistance  Sitting-balance support No upper extremity supported;Feet supported  Sitting balance-Leahy Scale Fair  Sitting balance - Comments Min guard assist to sit EOB   Standing balance support Bilateral upper extremity supported;During functional activity  Standing balance-Leahy Scale Poor  Standing balance comment Needed min assist to  maintain standing with heavey reliance on UEs for support  General Comments  General comments (skin integrity, edema, etc.) Pt reports that numbness was on anterior lower leg and that currently incr numbness to foot and heel as well as medial and lateral LE.  Called Vascular oncall MD Dr. Edilia Bo who will come and check on pt.  Pt too painful to tolerate more than quad sets.  Exercises  Exercises General Lower Extremity  General Exercises - Lower Extremity  Quad Sets AROM;Both;10 reps;Supine  PT - End of Session  Equipment Utilized During Treatment Gait belt  Activity Tolerance Patient limited by fatigue;Patient limited by pain  Patient left with call bell/phone within reach;with family/visitor present;in bed  Nurse Communication Mobility status;Patient requests pain meds;Weight bearing status   PT - Assessment/Plan  PT Plan Current plan remains appropriate  PT Visit Diagnosis Unsteadiness on feet (R26.81);Muscle weakness (generalized) (M62.81);Pain;Difficulty in walking, not elsewhere classified (R26.2)  Pain - Right/Left Left  Pain - part of body  Leg;Hip;Knee  PT Frequency (ACUTE ONLY) Min 6X/week  Follow Up Recommendations Home health- will not be covered;family to do therapy PT;Supervision/Assistance - 24 hour  PT equipment Rolling walker with 5" wheels;3in1 (PT)  AM-PAC PT "6 Clicks" Daily Activity Outcome Measure  Difficulty turning over in bed (including adjusting bedclothes, sheets and blankets)? 1  Difficulty moving from lying on back to sitting on the side of the bed?  1  Difficulty sitting down on and standing up from a chair with arms (e.g., wheelchair, bedside commode, etc,.)? 1  Help needed moving to and from a bed to chair (including a wheelchair)? 3  Help needed walking in hospital room? 3  Help needed climbing 3-5 steps with a railing?  2  6 Click Score 11  Mobility G Code  CL  PT Goal Progression  Progress towards PT goals Progressing toward goals  PT Time Calculation  PT Start Time (ACUTE ONLY) 1212  PT Stop Time (ACUTE ONLY) 1236  PT Time Calculation (min) (ACUTE ONLY) 24 min  PT General Charges  $$ ACUTE PT VISIT 1 Visit  PT Treatments  $Gait Training 8-22 mins  $Therapeutic Exercise 8-22 mins  Pt was able to progress ambulation in pm and maintain TDWB left LE.  Did notify MD regarding incr numbness left LE.  Will follow acutely. Thanks.  Clinton Kelley,PT Acute Rehabilitation Services Pager:  [phone]  Office:  [phone]

## 2017-09-13 NOTE — Progress Notes (Signed)
Orthopedic Trauma Service Progress Note   Patient ID: MCKAY TEGTMEYER MRN: [id number] DOB/AGE: 1985/05/02 32 y.o.  Subjective:  Doing very well Not in much pain  Denies pain in other extremities  More sore than anything else Denies and numbness or tingling in L leg   + void  + flatus Tolerating diet   L leg feels good to him, doesn't feel malrotated or short    Review of Systems  Constitutional: Negative for chills and fever.  Respiratory: Negative for shortness of breath and wheezing.   Cardiovascular: Negative for chest pain and palpitations.  Gastrointestinal: Negative for abdominal pain, nausea and vomiting.  Genitourinary: Negative for dysuria.  Neurological: Negative for tingling and sensory change.    Objective:   VITALS:   Vitals:   09/12/17 1855 09/12/17 1925 09/12/17 1956 09/13/17 0514  BP: (!) 148/81 (!) 150/85 (!) 143/86 124/71  Pulse: 77 74  82  Resp: 19 13 11 18   Temp:   98.4 F (36.9 C) 98.6 F (37 C)  TempSrc:   Oral Oral  SpO2: 93% 100%  98%  Weight:      Height:        Estimated body mass index is 25.83 kg/m as calculated from the following:   Height as of this encounter: 5\' 10"  (1.778 m).   Weight as of this encounter: 81.6 kg.   Intake/Output      09/06 0701 - 09/07 0700 09/07 0701 - 09/08 0700   P.O.  300   I.V. (mL/kg) 2850 (34.9) 1063.2 (13)   Total Intake(mL/kg) 2850 (34.9) 1363.2 (16.7)   Urine (mL/kg/hr) 2365 750 (2.9)   Drains 75 100   Blood 400    Total Output 2840 850   Net +10 +513.2          LABS  Results for orders placed or performed during the hospital encounter of 09/12/17 (from the past 24 hour(s))  Comprehensive metabolic panel     Status: Abnormal   Collection Time: 09/12/17 11:26 AM  Result Value Ref Range   Sodium 142 135 - 145 mmol/L   Potassium 3.5 3.5 - 5.1 mmol/L   Chloride 108 98 - 111 mmol/L   CO2 24 22 - 32 mmol/L   Glucose, Bld 128 (H) 70 - 99 mg/dL   BUN 15 6 - 20  mg/dL   Creatinine, Ser 5.36 0.61 - 1.24 mg/dL   Calcium 9.1 8.9 - 64.4 mg/dL   Total Protein 7.0 6.5 - 8.1 g/dL   Albumin 4.4 3.5 - 5.0 g/dL   AST 50 (H) 15 - 41 U/L   ALT 48 (H) 0 - 44 U/L   Alkaline Phosphatase 53 38 - 126 U/L   Total Bilirubin 1.3 (H) 0.3 - 1.2 mg/dL   GFR calc non Af Amer >60 >60 mL/min   GFR calc Af Amer >60 >60 mL/min   Anion gap 10 5 - 15  CBC     Status: Abnormal   Collection Time: 09/12/17 11:26 AM  Result Value Ref Range   WBC 14.5 (H) 4.0 - 10.5 K/uL   RBC 4.71 4.22 - 5.81 MIL/uL   Hemoglobin 14.5 13.0 - 17.0 g/dL   HCT 03.4 74.2 - 59.5 %   MCV 91.5 78.0 - 100.0 fL   MCH 30.8 26.0 - 34.0 pg   MCHC 33.6 30.0 - 36.0 g/dL   RDW 63.8 75.6 - 43.3 %   Platelets 225 150 - 400 K/uL  Ethanol  Status: None   Collection Time: 09/12/17 11:26 AM  Result Value Ref Range   Alcohol, Ethyl (B) <10 <10 mg/dL  Protime-INR     Status: None   Collection Time: 09/12/17 11:26 AM  Result Value Ref Range   Prothrombin Time 13.0 11.4 - 15.2 seconds   INR 0.99   Sample to Blood Bank     Status: None   Collection Time: 09/12/17 11:26 AM  Result Value Ref Range   Blood Bank Specimen SAMPLE AVAILABLE FOR TESTING    Sample Expiration      09/13/2017 Performed at [facility], [address], [place], [place] [address]   Type and screen [facility]     Status: None (Preliminary result)   Collection Time: 09/12/17 11:26 AM  Result Value Ref Range   ABO/RH(D) O POS    Antibody Screen NEG    Sample Expiration 09/15/2017    Unit Number [id number]    Blood Component Type RED CELLS,LR    Unit division 00    Status of Unit ALLOCATED    Transfusion Status OK TO TRANSFUSE    Crossmatch Result      Compatible Performed at [facility], [address] [place], [place] [address]    Unit Number [id number]    Blood Component Type RED CELLS,LR    Unit division 00    Status of Unit ALLOCATED    Transfusion Status OK TO TRANSFUSE     Crossmatch Result Compatible   Prepare RBC (crossmatch)     Status: None   Collection Time: 09/12/17 11:26 AM  Result Value Ref Range   Order Confirmation      ORDER PROCESSED BY BLOOD BANK Performed at [facility], [address] [place], [place] [address]   ABO/Rh     Status: None   Collection Time: 09/12/17 11:26 AM  Result Value Ref Range   ABO/RH(D)      O POS Performed at [facility], [address], [place], [place] [address]   I-Stat Chem 8, ED     Status: Abnormal   Collection Time: 09/12/17 11:33 AM  Result Value Ref Range   Sodium 140 135 - 145 mmol/L   Potassium 3.3 (L) 3.5 - 5.1 mmol/L   Chloride 106 98 - 111 mmol/L   BUN 19 6 - 20 mg/dL   Creatinine, Ser 4.40 0.61 - 1.24 mg/dL   Glucose, Bld 347 (H) 70 - 99 mg/dL   Calcium, Ion 4.25 (L) 1.15 - 1.40 mmol/L   TCO2 24 22 - 32 mmol/L   Hemoglobin 14.6 13.0 - 17.0 g/dL   HCT 95.6 38.7 - 56.4 %  I-Stat CG4 Lactic Acid, ED     Status: None   Collection Time: 09/12/17 11:34 AM  Result Value Ref Range   Lactic Acid, Venous 1.48 0.5 - 1.9 mmol/L  I-STAT 7, (LYTES, BLD GAS, ICA, H+H)     Status: Abnormal   Collection Time: 09/12/17  2:23 PM  Result Value Ref Range   pH, Arterial 7.348 (L) 7.350 - 7.450   pCO2 arterial 44.2 32.0 - 48.0 mmHg   pO2, Arterial 234.0 (H) 83.0 - 108.0 mmHg   Bicarbonate 24.4 20.0 - 28.0 mmol/L   TCO2 26 22 - 32 mmol/L   O2 Saturation 100.0 %   Acid-base deficit 1.0 0.0 - 2.0 mmol/L   Sodium 139 135 - 145 mmol/L   Potassium 3.4 (L) 3.5 - 5.1 mmol/L  Calcium, Ion 1.12 (L) 1.15 - 1.40 mmol/L   HCT 34.0 (L) 39.0 - 52.0 %   Hemoglobin 11.6 (L) 13.0 - 17.0 g/dL   Patient temperature 03.0 C    Sample type ARTERIAL   CBC     Status: Abnormal   Collection Time: 09/13/17  5:00 AM  Result Value Ref Range   WBC 12.9 (H) 4.0 - 10.5 K/uL   RBC 3.29 (L) 4.22 - 5.81 MIL/uL   Hemoglobin 10.2 (L) 13.0 - 17.0 g/dL   HCT 09.2 (L) 33.0 - 07.6 %   MCV 91.2 78.0 - 100.0 fL   MCH 31.0  26.0 - 34.0 pg   MCHC 34.0 30.0 - 36.0 g/dL   RDW 22.6 33.3 - 54.5 %   Platelets 183 150 - 400 K/uL  Comprehensive metabolic panel     Status: Abnormal   Collection Time: 09/13/17  5:00 AM  Result Value Ref Range   Sodium 137 135 - 145 mmol/L   Potassium 3.7 3.5 - 5.1 mmol/L   Chloride 105 98 - 111 mmol/L   CO2 24 22 - 32 mmol/L   Glucose, Bld 142 (H) 70 - 99 mg/dL   BUN 9 6 - 20 mg/dL   Creatinine, Ser 6.25 0.61 - 1.24 mg/dL   Calcium 7.6 (L) 8.9 - 10.3 mg/dL   Total Protein 5.1 (L) 6.5 - 8.1 g/dL   Albumin 3.2 (L) 3.5 - 5.0 g/dL   AST 38 15 - 41 U/L   ALT 36 0 - 44 U/L   Alkaline Phosphatase 33 (L) 38 - 126 U/L   Total Bilirubin 1.3 (H) 0.3 - 1.2 mg/dL   GFR calc non Af Amer >60 >60 mL/min   GFR calc Af Amer >60 >60 mL/min   Anion gap 8 5 - 15  TSH     Status: None   Collection Time: 09/13/17  5:00 AM  Result Value Ref Range   TSH 1.138 0.350 - 4.500 uIU/mL  Magnesium     Status: None   Collection Time: 09/13/17  5:00 AM  Result Value Ref Range   Magnesium 2.0 1.7 - 2.4 mg/dL  Phosphorus     Status: None   Collection Time: 09/13/17  5:00 AM  Result Value Ref Range   Phosphorus 3.0 2.5 - 4.6 mg/dL  Hemoglobin W3S     Status: None   Collection Time: 09/13/17  5:00 AM  Result Value Ref Range   Hgb A1c MFr Bld 5.2 4.8 - 5.6 %   Mean Plasma Glucose 102.54 mg/dL  Prealbumin     Status: None   Collection Time: 09/13/17  5:00 AM  Result Value Ref Range   Prealbumin 22.8 18 - 38 mg/dL  Urine rapid drug screen (hosp performed)     Status: Abnormal   Collection Time: 09/13/17  5:48 AM  Result Value Ref Range   Opiates NONE DETECTED NONE DETECTED   Cocaine NONE DETECTED NONE DETECTED   Benzodiazepines POSITIVE (A) NONE DETECTED   Amphetamines NONE DETECTED NONE DETECTED   Tetrahydrocannabinol NONE DETECTED NONE DETECTED   Barbiturates NONE DETECTED NONE DETECTED     PHYSICAL EXAM:   Gen: appears well, sitting in bedside chair, pleasant and very appreciative   Lungs:  CTA B Cardiac: RRR, s1 and s2 Abd: + BS, NTND Pelvis: no instability with LC or AP compression  Ext:       Left Lower Extremity   Dressing L thigh c/d/i  JP drain in place  No  significant swelling to L lower leg   DPN, SPN, TN sensation intact  EHL, FHL, AT, PT, peroneals, gastroc motor intact  + Quad set   + knee flexion   No pain with passive stretching of leg          R LEx, B UEx   No crepitus or gross motion with manipulation of his extremities  Soft tissue contusion to R forearm but no crepitus with stressing of his forearm   Motor and sensory functions intact to remaining extremities  exts are warm with + peripheral pulses   No blocks to motion   No traumatic wounds   Assessment/Plan: 1 Day Post-Op   Principal Problem:   Fall from tree Active Problems:   Rupture of artery (HCC)   Ischemia of extremity   Femur fracture, left (HCC)   Anti-infectives (From admission, onward)   Start     Dose/Rate Route Frequency Ordered Stop   09/12/17 2200  ceFAZolin (ANCEF) IVPB 2g/100 mL premix  Status:  Discontinued     2 g 200 mL/hr over 30 Minutes Intravenous Every 8 hours 09/12/17 2013 09/12/17 2039   09/12/17 2015  ceFAZolin (ANCEF) IVPB 2g/100 mL premix     2 g 200 mL/hr over 30 Minutes Intravenous Every 8 hours 09/12/17 2013 09/13/17 0446    .  POD/HD#: 1  32 y/o male s/p fall from tree   -comminuted L midshaft femur fracture with SFA injury s/p retrograde IMN and vascular repair  TDWB L leg  Unrestricted ROM L knee and hip   PT/OT   Pt can use crutches or walker   Ice PRN  Dressing change per vascular team     Do not place pillows under knee when at rest. Place pillows under ankle to keep knee in full extension when at rest     - Pain management:  As follows   Tylenol 500 mg po q8h scheduled   Percocet 5/325 1-2 po q8h prn pain (primary opioid modality)   Oxy IR 5-10 mg po q4h prn breakthrough pain only---> give only between percocet doses if  needed   Morphine 2 mg IV q3h prn severe breakthrough pain---> give only after all po meds have been given     Robaxin 750 mg po q8h scheduled   - ABL anemia/Hemodynamics  Stable  - Medical issues   Nicotine dependence   Dicussed deleterious effects of nicotine on bone and wound healing   He currently vapes and was wanting to quit   States he will quit   Can see how he is doing at his first appointment and if struggling could try chantix or wellbutrin (we have had better success with chantix with less SE's but pt has medicaid and this may not be covered)   - DVT/PE prophylaxis:  Lovenox    Recommend lovenox 40 mg sq daily x 28 days  - ID:   periop abx completed  - Metabolic Bone Disease:  Clinically pts bone quality was very poor intra-op  Check labs to evaluate for any correctable problems--->labs are pending    Will start vitamin d, calcium and vitamin c - Activity:  TDWB L leg  - FEN/GI prophylaxis/Foley/Lines:  Reg diet   Foley dc'd   - Impediments to fracture healing:  Nicotine use  Associated vascular injury    Suspect moderate periosteal stripping given magnitude of injury   ?metabolic bone disease  - Dispo:  PT/OT evals  Follow up with ortho in 10-14  days   Stable from ortho standpoint    Mearl Latin, PA-C [facility] [phone] Traci Sermon (C) 09/13/2017, 10:10 AM

## 2017-09-13 NOTE — Progress Notes (Addendum)
  Progress Note 09/13/2017 7:30 AM 1 Day Post-Op  Subjective:  Says he's a little sore but okay.  Says his foot feels ok.  Afebrile HR 70's-90's NSR 120's-140's systolic 98% RA  Vitals:   09/12/17 1956 09/13/17 0514  BP: (!) 143/86 124/71  Pulse:  82  Resp: 11 18  Temp: 98.4 F (36.9 C) 98.6 F (37 C)  SpO2:  98%   Physical Exam: Cardiac:  regular Lungs:  Non labored  Incisions:  Wrapped with ace Extremities:  Easily palpable left DP pulse; calf and anterior compartments are soft.  CBC    Component Value Date/Time   WBC 12.9 (H) 09/13/2017 0500   RBC 3.29 (L) 09/13/2017 0500   HGB 10.2 (L) 09/13/2017 0500   HCT 30.0 (L) 09/13/2017 0500   PLT 183 09/13/2017 0500   MCV 91.2 09/13/2017 0500   MCH 31.0 09/13/2017 0500   MCHC 34.0 09/13/2017 0500   RDW 12.5 09/13/2017 0500   BMET    Component Value Date/Time   NA 137 09/13/2017 0500   K 3.7 09/13/2017 0500   CL 105 09/13/2017 0500   CO2 24 09/13/2017 0500   GLUCOSE 142 (H) 09/13/2017 0500   BUN 9 09/13/2017 0500   CREATININE 0.90 09/13/2017 0500   CALCIUM 7.6 (L) 09/13/2017 0500   GFRNONAA >60 09/13/2017 0500   GFRAA >60 09/13/2017 0500    Intake/Output Summary (Last 24 hours) at 09/13/2017 0730 Last data filed at 09/13/2017 0514 Gross per 24 hour  Intake 2850 ml  Output 2840 ml  Net 10 ml   Assessment:  32 y.o. male is s/p:  1.  Exploration of left superficial femoral artery 2.  Excision of damage segment of artery and repair with interposition saphenous vein graft from left leg (Dr. Edilia Bo) And   1.  Intramedullary nailing of the left femur using a retrograde [place] 9 x 400 mm statically locked nail. 2.  Repair of left superficial femoral artery rupture. (Dr. Carola Frost) 1 Day Post-Op  Plan: -pt with palpable left DP pulse.  No evidence of compartment syndrome as calf and anterior compartments are soft.  -JP drain with 75 cc out recorded and there is about 50 cc in the bulb this am of serosanguinous and  bloody drainage.  Will leave drain today -PT eval and treat-weight bearing per ortho -continue leg elevation-discussed with pt how to elevate leg -will -acute blood loss anemia-tolerating -DVT prophylaxis:  Sq Lovenox to start this am  Doreatha Massed, PA-C [facility] [phone] 09/13/2017 7:30 AM  I have interviewed the patient and examined the patient. I agree with the findings by the PA. There was some damage to the muscle from the femur fracture which was causing some oozing in this area.  For this reason we will need to leave the drain at least another day. He will start physical therapy today. Home when the drain is out and he passes physical therapy.  Cari Caraway, MD [phone]

## 2017-09-13 NOTE — Anesthesia Postprocedure Evaluation (Signed)
Anesthesia Post Note  Patient: Clinton Kelley  Procedure(s) Performed: INTRAMEDULLARY (IM) [place]ENIX RETROGRADE FEMORAL NAILING (Left ) EXPLORATION OF LEFT SUPERFICIAL FEMORAL ARTERY, REMOVAL OF DAMAGED SEGMENT  WITH INTERPOSITION VEIN GRAFTING FROM LEFT LEG (Left Leg Upper)     Patient location during evaluation: PACU Anesthesia Type: General Level of consciousness: awake and alert Pain management: pain level controlled Vital Signs Assessment: post-procedure vital signs reviewed and stable Respiratory status: spontaneous breathing, nonlabored ventilation, respiratory function stable and patient connected to nasal cannula oxygen Cardiovascular status: blood pressure returned to baseline and stable Postop Assessment: no apparent nausea or vomiting Anesthetic complications: no    Last Vitals:  Vitals:   09/12/17 1956 09/13/17 0514  BP: (!) 143/86 124/71  Pulse:  82  Resp: 11 18  Temp: 36.9 C 37 C  SpO2:  98%    Last Pain:  Vitals:   09/13/17 0514  TempSrc: Oral  PainSc:                  [name]

## 2017-09-14 ENCOUNTER — Encounter (HOSPITAL_COMMUNITY): Payer: Medicaid Other

## 2017-09-14 LAB — CBC
HCT: 27.5 % — ABNORMAL LOW (ref 39.0–52.0)
Hemoglobin: 9 g/dL — ABNORMAL LOW (ref 13.0–17.0)
MCH: 30.3 pg (ref 26.0–34.0)
MCHC: 32.7 g/dL (ref 30.0–36.0)
MCV: 92.6 fL (ref 78.0–100.0)
Platelets: 148 10*3/uL — ABNORMAL LOW (ref 150–400)
RBC: 2.97 MIL/uL — ABNORMAL LOW (ref 4.22–5.81)
RDW: 12.5 % (ref 11.5–15.5)
WBC: 10.1 10*3/uL (ref 4.0–10.5)

## 2017-09-14 LAB — TESTOSTERONE, FREE: Testosterone, Free: 1.9 pg/mL — ABNORMAL LOW (ref 8.7–25.1)

## 2017-09-14 LAB — PTH, INTACT AND CALCIUM
CALCIUM TOTAL (PTH): 7.5 mg/dL — AB (ref 8.7–10.2)
PTH: 30 pg/mL (ref 15–65)

## 2017-09-14 LAB — CALCIUM, IONIZED: Calcium, Ionized, Serum: 4.5 mg/dL (ref 4.5–5.6)

## 2017-09-14 NOTE — Progress Notes (Signed)
OT Cancellation Note  Patient Details Name: Clinton Kelley MRN: [id number] DOB: [birth year]   Cancelled Treatment:    Reason Eval/Treat Not Completed: Pain limiting ability to participate. Will follow up as able.   Chancy Milroy, OT Acute Rehabilitation Services Pager [phone] Office [phone]   Chancy Milroy 09/14/2017, 11:41 AM

## 2017-09-14 NOTE — Evaluation (Signed)
Occupational Therapy Evaluation Patient Details Name: Clinton Kelley MRN: [id number] DOB: 12/20/1985 Today's Date: 09/14/2017    History of Present Illness Clinton Kelley is a 32 y.o. male who fell 30 feet out of a tree.  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  Also T9 compression fracture.  After Dr. Carola Frost fix his broken femur and Dr. Edilia Bo repaired the artery.  IM nail left femur and repair of left femoral artery rupture.  PMH: back surgery   Clinical Impression   PTA patient independent and working.  Admitted for above and currently requires min guard assist for toilet transfers to 3:1 commode using RW, mod-max assist for LB ADLs, setup assist for UB ADLs seated, and mod assist for toileting.  He is limited by pain and limited ROM of L LE. Good adherence to TDWB L LE, requires increased time and effort for all activities. Educated on precautions, safety, mobility, ADL compensatory techniques, and DME/recommendations.  He will have 24/7 support at discharge. Will continue to follow while admitted in order to maximize safety and independence with ADLs and functional transfers, but do not anticipate further OT needs after dc.     Follow Up Recommendations  No OT follow up;Supervision/Assistance - 24 hour    Equipment Recommendations  3 in 1 bedside commode    Recommendations for Other Services       Precautions / Restrictions Precautions Precautions: Fall Precaution Comments: JP drain left LE Restrictions Weight Bearing Restrictions: Yes LLE Weight Bearing: Touchdown weight bearing      Mobility Bed Mobility               General bed mobility comments: seated OOB in recliner  Transfers Overall transfer level: Needs assistance Equipment used: Rolling walker (2 wheeled) Transfers: Sit to/from Stand Sit to Stand: Min guard         General transfer comment: min guard for  safety, cueing for hand placement and technique; patients sister and spouse assist with L LE managment using recliner (at end of session patient managing L LE more himself)    Balance Overall balance assessment: Needs assistance Sitting-balance support: No upper extremity supported;Feet supported Sitting balance-Leahy Scale: Fair     Standing balance support: Bilateral upper extremity supported;During functional activity Standing balance-Leahy Scale: Poor Standing balance comment: heavy reliance on B UE support using RW                            ADL either performed or assessed with clinical judgement   ADL Overall ADL's : Needs assistance/impaired     Grooming: Supervision/safety;Set up;Sitting   Upper Body Bathing: Set up;Supervision/ safety;Sitting   Lower Body Bathing: Moderate assistance;Sit to/from stand Lower Body Bathing Details (indicate cue type and reason): decreased reach to L LE, impaired balance Upper Body Dressing : Set up;Sitting   Lower Body Dressing: Maximal assistance;Sit to/from stand Lower Body Dressing Details (indicate cue type and reason): decreased reach to L LE, pain and impaired balance Toilet Transfer: Min guard;Ambulation;BSC;RW;Regular Toilet(3:1 over commode ) Toilet Transfer Details (indicate cue type and reason): reviewed safety, techniques and 3:1 commode Toileting- Clothing Manipulation and Hygiene: Moderate assistance;Sit to/from stand       Functional mobility during ADLs: Min guard;Rolling walker General ADL Comments: limited by pain, encouragement to participate in session requires increased time and effort; cueing for reciproal gait  pattern during mobility but able to adhere to TDWB retrictions to L LE      Vision   Vision Assessment?: No apparent visual deficits     Perception     Praxis      Pertinent Vitals/Pain Pain Assessment: Faces Faces Pain Scale: Hurts whole lot Pain Location: L LE  Pain Descriptors /  Indicators: Aching;Grimacing;Guarding;Operative site guarding Pain Intervention(s): Limited activity within patient's tolerance;Repositioned;Premedicated before session     Hand Dominance     Extremity/Trunk Assessment Upper Extremity Assessment Upper Extremity Assessment: Overall WFL for tasks assessed   Lower Extremity Assessment Lower Extremity Assessment: Defer to PT evaluation   Cervical / Trunk Assessment Cervical / Trunk Assessment: Normal   Communication Communication Communication: No difficulties   Cognition Arousal/Alertness: Awake/alert Behavior During Therapy: WFL for tasks assessed/performed Overall Cognitive Status: Within Functional Limits for tasks assessed                                     General Comments  sister and spouse present    Exercises     Shoulder Instructions      Home Living Family/patient expects to be discharged to:: Private residence Living Arrangements: Spouse/significant other;Children Available Help at Discharge: Family;Available 24 hours/day Type of Home: House Home Access: Stairs to enter [employer] of Steps: 1 Entrance Stairs-Rails: Right Home Layout: One level     Bathroom Shower/Tub: Chief Strategy Officer: Standard     Home Equipment: None          Prior Functioning/Environment Level of Independence: Independent        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        OT Problem List: Decreased range of motion;Decreased activity tolerance;Impaired balance (sitting and/or standing);Decreased safety awareness;Decreased knowledge of use of DME or AE;Decreased knowledge of precautions;Pain      OT Treatment/Interventions: Self-care/ADL training;Therapeutic exercise;Energy conservation;DME and/or AE instruction;Therapeutic activities;Patient/family education;Balance training    OT Goals(Current goals can be found in the care plan section) Acute Rehab OT Goals Patient  Stated Goal: to go home and get around on my own OT Goal Formulation: With patient Time For Goal Achievement: 09/28/17 Potential to Achieve Goals: Good  OT Frequency: Min 2X/week   Barriers to D/C:            Co-evaluation              AM-PAC PT "6 Clicks" Daily Activity     Outcome Measure Help from another person eating meals?: None Help from another person taking care of personal grooming?: None(seated) Help from another person toileting, which includes using toliet, bedpan, or urinal?: A Lot Help from another person bathing (including washing, rinsing, drying)?: A Lot Help from another person to put on and taking off regular upper body clothing?: None Help from another person to put on and taking off regular lower body clothing?: A Lot 6 Click Score: 18   End of Session Equipment Utilized During Treatment: Rolling walker  Activity Tolerance: Patient tolerated treatment well;Patient limited by pain Patient left: in chair;with call bell/phone within reach;with family/visitor present  OT Visit Diagnosis: Other abnormalities of gait and mobility (R26.89);Pain Pain - Right/Left: Left Pain - part of body: Hip;Knee;Leg                Time: 4540-9811 OT Time Calculation (min): 19 min Charges:  OT General Charges $OT Visit: 1  Visit OT Evaluation $OT Eval Moderate Complexity: 1 Mod  Chancy Milroy, [place] Acute Rehabilitation Services Pager [phone] Office [phone]   Chancy Milroy 09/14/2017, 4:38 PM

## 2017-09-14 NOTE — Progress Notes (Addendum)
Physical Therapy Treatment Patient Details Name: Clinton Kelley MRN: [id number] DOB: 1985-12-02 Today's Date: 09/14/2017    History of Present Illness Clinton Kelley is a 32 y.o. male who fell 30 feet out of a tree.  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  Also T9 compression fracture.  After Dr. Carola Frost fix his broken femur and Dr. Edilia Bo repaired the artery.  IM nail left femur and repair of left femoral artery rupture.  PMH: back surgery    PT Comments    Patient unable to progress with therapy today due to high levels of pain, despite recent admission of oral and IV medication.  Unable to walk with RW in room. HR 90 resting 130 with sitting. Took 20 minutes of slow and painful mobilization to get from supine to standing EOB. Reveiwed therex and left handout with patient. Will cont to follow and progress as tolerated.  (of note pt ambulated 25' with therapy last visit).    Follow Up Recommendations  Home health PT;Supervision/Assistance - 24 hour     Equipment Recommendations  Rolling walker with 5" wheels;3in1 (PT)    Recommendations for Other Services       Precautions / Restrictions Precautions Precautions: Fall Precaution Comments: JP drain left LE Restrictions Weight Bearing Restrictions: Yes LLE Weight Bearing: Touchdown weight bearing    Mobility  Bed Mobility Overal bed mobility: Needs Assistance Bed Mobility: Sit to Supine     Supine to sit: +2 for physical assistance;Min assist     General bed mobility comments: pt limited by pain, assist to bring up and support LLE.  Transfers Overall transfer level: Needs assistance Equipment used: Rolling walker (2 wheeled) Transfers: Sit to/from Stand Sit to Stand: Min assist Stand pivot transfers: Min assist          Ambulation/Gait             General Gait Details: unable to take steps today due to high  pain   Stairs             Wheelchair Mobility    Modified Rankin (Stroke Patients Only)       Balance Overall balance assessment: Needs assistance Sitting-balance support: No upper extremity supported;Feet supported Sitting balance-Leahy Scale: Fair Sitting balance - Comments: Min guard assist to sit EOB                                     Cognition Arousal/Alertness: Awake/alert Behavior During Therapy: WFL for tasks assessed/performed Overall Cognitive Status: Within Functional Limits for tasks assessed                                        Exercises General Exercises - Lower Extremity Ankle Circles/Pumps: Both;10 reps Quad Sets: 10 reps Gluteal Sets: 10 reps Short Arc Quad: 10 reps Long Arc Quad: 10 reps Heel Slides: 10 reps    General Comments        Pertinent Vitals/Pain Pain Assessment: Faces Faces Pain Scale: Hurts worst Pain Location: left LE with movement up to 10, 3 at rest Pain Descriptors / Indicators: Aching;Grimacing;Guarding;Operative site guarding Pain Intervention(s): Limited activity within patient's tolerance;Monitored during session;Premedicated before session    Home Living  Prior Function            PT Goals (current goals can now be found in the care plan section) Acute Rehab PT Goals Patient Stated Goal: to go home and get around on my own PT Goal Formulation: With patient Time For Goal Achievement: 09/27/17 Potential to Achieve Goals: Good Progress towards PT goals: Not progressing toward goals - comment(limited by pain)    Frequency    Min 6X/week      PT Plan Current plan remains appropriate    Co-evaluation              AM-PAC PT "6 Clicks" Daily Activity  Outcome Measure  Difficulty turning over in bed (including adjusting bedclothes, sheets and blankets)?: Unable Difficulty moving from lying on back to sitting on the side of the bed? :  Unable Difficulty sitting down on and standing up from a chair with arms (e.g., wheelchair, bedside commode, etc,.)?: Unable Help needed moving to and from a bed to chair (including a wheelchair)?: A Lot Help needed walking in hospital room?: Total Help needed climbing 3-5 steps with a railing? : Total 6 Click Score: 7    End of Session Equipment Utilized During Treatment: Gait belt Activity Tolerance: Patient limited by fatigue;Patient limited by pain Patient left: with call bell/phone within reach;with family/visitor present;in bed Nurse Communication: Mobility status;Patient requests pain meds;Weight bearing status PT Visit Diagnosis: Unsteadiness on feet (R26.81);Muscle weakness (generalized) (M62.81);Pain;Difficulty in walking, not elsewhere classified (R26.2) Pain - Right/Left: Left Pain - part of body: Leg;Hip;Knee     Time: 1000-1046 PT Time Calculation (min) (ACUTE ONLY): 46 min  Charges:  $Therapeutic Exercise: 8-22 mins $Therapeutic Activity: 8-22 mins                     Etta Grandchild, PT, DPT Acute Rehabilitation Services Pager: [phone] Office: [phone]     Etta Grandchild 09/14/2017, 12:00 PM

## 2017-09-14 NOTE — Progress Notes (Addendum)
Progress Note    09/14/2017 7:09 AM 2 Days Post-Op  Subjective:  Says he walked this morning and is sore.  Says he has numbness on the inner part of his leg below the knee but no numbness of the foot.  Tm 99.9 now afebrile HR 80's-100's NSR 120's-130's systolic 96% RA  Vitals:   09/14/17 0014 09/14/17 0502  BP: 129/80 126/84  Pulse: 98 95  Resp: 16 15  Temp: 99.9 F (37.7 C) 98.8 F (37.1 C)  SpO2: 98% 98%    Physical Exam: Cardiac:  regular Lungs:  Non labored Incisions:  Thigh incision is clean with minimal bloody ooze and ecchymosis around the thigh incision.  Below knee incision is clean and dry.  There is swelling in the left thigh Extremities:  Palpable left DP pulse; motor and sensation in tact left foot.  He does not have sensation on the medial side of the left leg below the knee.  Calf and anterior compartments are soft and non tender to palpation.   CBC    Component Value Date/Time   WBC 10.1 09/14/2017 0416   RBC 2.97 (L) 09/14/2017 0416   HGB 9.0 (L) 09/14/2017 0416   HCT 27.5 (L) 09/14/2017 0416   PLT 148 (L) 09/14/2017 0416   MCV 92.6 09/14/2017 0416   MCH 30.3 09/14/2017 0416   MCHC 32.7 09/14/2017 0416   RDW 12.5 09/14/2017 0416    BMET    Component Value Date/Time   NA 137 09/13/2017 0500   K 3.7 09/13/2017 0500   CL 105 09/13/2017 0500   CO2 24 09/13/2017 0500   GLUCOSE 142 (H) 09/13/2017 0500   BUN 9 09/13/2017 0500   CREATININE 0.90 09/13/2017 0500   CALCIUM 7.6 (L) 09/13/2017 0500   GFRNONAA >60 09/13/2017 0500   GFRAA >60 09/13/2017 0500    INR    Component Value Date/Time   INR 0.99 09/12/2017 1126     Intake/Output Summary (Last 24 hours) at 09/14/2017 0709 Last data filed at 09/14/2017 0502 Gross per 24 hour  Intake 1363.16 ml  Output 910 ml  Net 453.16 ml     Assessment:  32 y.o. male is s/p:  1.Exploration of left superficial femoral artery 2.Excision of damage segment of artery and repair with interposition  saphenous vein graft from left leg (Dr. Edilia Bo) And   1. Intramedullary nailing of the left femur using a retrograde [place] 9 x 400 mm statically locked nail. 2. Repair of left superficial femoral artery rupture. (Dr. Carola Frost)  2 Days Post-Op   Plan: -palpable left DP pulse; motor and sensation in tact in left foot.  He does have some numbness on the medial side of the leg below the knee.  Calf and anterior compartments are soft and non tender to palpation.  Ace bandage removed and incision looks good-there is ecchymosis around the incision on the thigh -DVT prophylaxis:  Lovenox 40mg  daily x 28 days per ortho -leg elevation:   Per ortho:  Do not place pillows under knee when at rest. Place pillows under ankle to keep knee in full extension when at rest (order changed) -pain management per ortho: Tylenol 500 mg po q8h scheduled                         Percocet 5/325 1-2 po q8h prn pain (primary opioid modality)  Oxy IR 5-10 mg po q4h prn breakthrough pain only---> give only between percocet doses if needed                         Morphine 2 mg IV q3h prn severe breakthrough pain---> give only after all po meds have been given    Robaxin 750 mg po q8h scheduled  -per ortho, no pillow behind knee at rest - only ankle -metabolic bone dz:  Vitamin D, Calcium, and Vit C started per ortho   Doreatha Massed, PA-C [facility] [phone] 09/14/2017 7:09 AM  I have interviewed the patient and examined the patient. I agree with the findings by the PA. Continue physical therapy.  Plan discharge when passes physical therapy, pain is controlled, and his drain is out.  His JP drain 30 cc last shift.  This can probably come out tomorrow morning.  Cari Caraway, MD [phone]

## 2017-09-14 NOTE — Progress Notes (Signed)
Subjective: 2 Days Post-Op Procedure(s) (LRB): INTRAMEDULLARY (IM) PHOENIX RETROGRADE FEMORAL NAILING (Left) EXPLORATION OF LEFT SUPERFICIAL FEMORAL ARTERY, REMOVAL OF DAMAGED SEGMENT  WITH INTERPOSITION VEIN GRAFTING FROM LEFT LEG (Left) Patient reports pain as moderate.    Objective: Vital signs in last 24 hours: Temp:  [98.3 F (36.8 C)-99.9 F (37.7 C)] 98.6 F (37 C) (09/08 0747) Pulse Rate:  [87-107] 91 (09/08 0747) Resp:  [15-21] 20 (09/08 0747) BP: (126-139)/(80-84) 139/81 (09/08 0747) SpO2:  [97 %-99 %] 99 % (09/08 0747) Arterial Line BP: (171)/(73) 171/73 (09/07 1100)  Intake/Output from previous day: 09/07 0701 - 09/08 0700 In: 1363.2 [P.O.:300; I.V.:1063.2] Out: 910 [Urine:750; Drains:160] Intake/Output this shift: Total I/O In: 2342.1 [P.O.:250; I.V.:2092.1] Out: -   Recent Labs    09/12/17 1126 09/12/17 1133 09/12/17 1423 09/13/17 0500 09/14/17 0416  HGB 14.5 14.6 11.6* 10.2* 9.0*   Recent Labs    09/13/17 0500 09/14/17 0416  WBC 12.9* 10.1  RBC 3.29* 2.97*  HCT 30.0* 27.5*  PLT 183 148*   Recent Labs    09/12/17 1126 09/12/17 1133 09/12/17 1423 09/13/17 0500  NA 142 140 139 137  K 3.5 3.3* 3.4* 3.7  CL 108 106  --  105  CO2 24  --   --  24  BUN 15 19  --  9  CREATININE 1.12 1.00  --  0.90  GLUCOSE 128* 128*  --  142*  CALCIUM 9.1  --   --  7.6*   Recent Labs    09/12/17 1126  INR 0.99    Neurovascular intact Sensation intact distally Dorsiflexion/Plantar flexion intact Incision: dressing C/D/I    Assessment/Plan: 2 Days Post-Op Procedure(s) (LRB): INTRAMEDULLARY (IM) PHOENIX RETROGRADE FEMORAL NAILING (Left) EXPLORATION OF LEFT SUPERFICIAL FEMORAL ARTERY, REMOVAL OF DAMAGED SEGMENT  WITH INTERPOSITION VEIN GRAFTING FROM LEFT LEG (Left)  32 y/o male s/p fall from tree   -comminuted L midshaft femur fracture with SFA injury s/p retrograde IMN and vascular repair             TDWB L leg             Unrestricted ROM L knee  and hip              PT/OT                         Pt can use crutches or walker              Ice PRN             Dressing change per vascular team                           Do not place pillows under knee when at rest. Place pillows under ankle to keep knee in full extension when at rest                - Pain management:             As follows                         Tylenol 500 mg po q8h scheduled                         Percocet 5/325 1-2 po q8h prn pain (primary opioid modality)  Oxy IR 5-10 mg po q4h prn breakthrough pain only---> give only between percocet doses if needed                         Morphine 2 mg IV q3h prn severe breakthrough pain---> give only after all po meds have been given                           Robaxin 750 mg po q8h scheduled   - ABL anemia/Hemodynamics             Stable  - Medical issues              Nicotine dependence                         Dicussed deleterious effects of nicotine on bone and wound healing                         He currently vapes and was wanting to quit                         States he will quit                         Can see how he is doing at his first appointment and if struggling could try chantix or wellbutrin (we have had better success with chantix with less SE's but pt has medicaid and this may not be covered)   - DVT/PE prophylaxis:             Lovenox                          Recommend lovenox 40 mg sq daily x 28 days  - ID:              periop abx completed  - Metabolic Bone Disease:             Clinically pts bone quality was very poor intra-op             Check labs to evaluate for any correctable problems--->labs are pending                          Will start vitamin d, calcium and vitamin c - Activity:             TDWB L leg  - FEN/GI prophylaxis/Foley/Lines:             Reg diet              Foley dc'd   - Impediments to fracture healing:             Nicotine use              Associated vascular injury                          Suspect moderate periosteal stripping given magnitude of injury              ?metabolic bone disease  - Dispo:             PT/OT evals  Follow up with ortho in 10-14 days              Stable from ortho standpoint    Margart Sickles 09/14/2017, 10:57 AM

## 2017-09-15 ENCOUNTER — Telehealth: Payer: Self-pay | Admitting: Vascular Surgery

## 2017-09-15 ENCOUNTER — Encounter (HOSPITAL_COMMUNITY): Payer: Medicaid Other

## 2017-09-15 ENCOUNTER — Inpatient Hospital Stay (HOSPITAL_COMMUNITY): Payer: Medicaid Other

## 2017-09-15 ENCOUNTER — Encounter (HOSPITAL_COMMUNITY): Payer: Self-pay | Admitting: Orthopedic Surgery

## 2017-09-15 LAB — TESTOSTERONE: TESTOSTERONE: 97 ng/dL — AB (ref 264–916)

## 2017-09-15 LAB — POCT I-STAT 7, (LYTES, BLD GAS, ICA,H+H)
Acid-base deficit: 2 mmol/L (ref 0.0–2.0)
Bicarbonate: 22.6 mmol/L (ref 20.0–28.0)
CALCIUM ION: 1.1 mmol/L — AB (ref 1.15–1.40)
HCT: 32 % — ABNORMAL LOW (ref 39.0–52.0)
Hemoglobin: 10.9 g/dL — ABNORMAL LOW (ref 13.0–17.0)
O2 Saturation: 100 %
POTASSIUM: 3.9 mmol/L (ref 3.5–5.1)
SODIUM: 140 mmol/L (ref 135–145)
TCO2: 24 mmol/L (ref 22–32)
pCO2 arterial: 35.2 mmHg (ref 32.0–48.0)
pH, Arterial: 7.415 (ref 7.350–7.450)
pO2, Arterial: 216 mmHg — ABNORMAL HIGH (ref 83.0–108.0)

## 2017-09-15 LAB — CALCITRIOL (1,25 DI-OH VIT D): VIT D 1 25 DIHYDROXY: 66 pg/mL (ref 19.9–79.3)

## 2017-09-15 LAB — VITAMIN D 25 HYDROXY (VIT D DEFICIENCY, FRACTURES): VIT D 25 HYDROXY: 38.1 ng/mL (ref 30.0–100.0)

## 2017-09-15 LAB — SEX HORMONE BINDING GLOBULIN: SEX HORMONE BINDING: 22.3 nmol/L (ref 16.5–55.9)

## 2017-09-15 NOTE — Discharge Summary (Signed)
Physician Discharge Summary   Patient ID: Clinton Kelley [id number] 32 y.o. May 15, 1985  Admit date: 09/12/2017  Discharge date and time: 09/15/17  Admitting Physician: Angelia Mould, MD   Discharge Physician: same  Admission Diagnoses: Ischemia of extremity [I99.8]  Left femur fracture  Discharge Diagnoses: same  Admission Condition: poor  Discharged Condition: fair  Indication for Admission: ischemic LLE and traumatic fracture of L femur  Hospital Course: Clinton Kelley is a 32y.o. Male who was brought in emergently after falling from a tree and fracturing his L femur and injuring L SFA causing LLE to be acutely ischemic.  He was brought emergently to the OR by Dr. Marcelino Scot for IM nailing of L femur.  He remained in OR for repair of L SFA with interposition vein graft by Dr. Scot Dock.  He tolerated these procedures well and was admitted to the hospital.  From a vascular surgery standpoint, incision remained unremarkable throughout hospital stay.  At time of discharge patient maintained a palpable L DP pulse.  The patient will follow up with Dr. Marcelino Scot in about 2 weeks.  He will have a hip xray prior to discharge home.  He is touch down weightbearing and medication recommendations by Orthopedic surgery will be reflected in the discharge medication reconciliation.  He will follow up with Dr. Scot Dock in about 2 weeks to assess incision and circulation. Discharge instructions were reviewed with the patient and he voices his understanding.  He will be discharged in stable condition this morning after JP is removed and hip XR is read.  Consults: orthopedic surgery  Treatments: IM nailing of L femur by Dr. Marcelino Scot 09/12/17 Repair of L SFA with interposition vein graft by Dr. Scot Dock 09/12/17  Discharge Exam: see progress note 09/15/17 Vitals:   09/14/17 2348 09/15/17 0522  BP: (!) 143/89 139/84  Pulse: 88 87  Resp: 15 18  Temp: 99 F (37.2 C) 99.2 F (37.3 C)  SpO2: 95% 95%      Disposition: Discharge disposition: 01-Home or Self Care       - For [facility] Registry use ---  Post-op:  Wound infection: No  Graft infection: No  New Arrhythmia: No Ipsilateral amputation: [x ] no, _0  Minor, _1  BKA, _2  AKA Patency judged by: _3  Dopper only, _4  Palpable graft pulse, [x ] Palpable distal pulse, _5  ABI inc. > 0.15, _6  Duplex D/C Ambulatory Status: Ambulatory with Assistance; weightbearing status per Orthopedics  Complications: MI: [ x] No, _7  Troponin only, _8  EKG or Clinical CHF: No Resp failure: [x ] none, _9  Pneumonia, _10  Ventilator Chg in renal function: [x ] none, _11  Inc. Cr > 0.5, _12  Temp. Dialysis, _13  Permanent dialysis Stroke: [x ] None, _14  Minor, _15  Major Return to OR: No  Reason for return to OR: _16  Bleeding, _17  Infection, _18  Thrombosis, _19  Revision  Discharge medications: Statin use:  No  for medical reason not indicated ASA use:  Yes Plavix use:  No  for medical reason not indicated Beta blocker use: No  for medical reason not indicated Coumadin use: No, lovenox for 1 month    Patient Instructions:  Allergies as of 09/15/2017      Reactions   Benadryl [diphenhydramine]       Medication List    TAKE these medications   acetaminophen 500 MG tablet Commonly known as:  TYLENOL Take 1 tablet (500 mg total) by  mouth every 8 (eight) hours.   amLODipine 2.5 MG tablet Commonly known as:  NORVASC Take 2.5 mg by mouth daily.   ascorbic acid 500 MG tablet Commonly known as:  VITAMIN C Take 1 tablet (500 mg total) by mouth daily.   calcium citrate 950 MG tablet Commonly known as:  CALCITRATE - dosed in mg elemental calcium Take 1 tablet (200 mg of elemental calcium total) by mouth 2 (two) times daily.   docusate sodium 100 MG capsule Commonly known as:  COLACE Take 1 capsule (100 mg total) by mouth daily.   enoxaparin 40 MG/0.4ML injection Commonly known as:  LOVENOX Inject 0.4 mLs (40 mg total) into the skin  daily.   GOODY HEADACHE PO Take 1 Package by mouth as needed (headache).   methocarbamol 750 MG tablet Commonly known as:  ROBAXIN Take 1 tablet (750 mg total) by mouth every 8 (eight) hours as needed for muscle spasms.   oxyCODONE 5 MG immediate release tablet Commonly known as:  Oxy IR/ROXICODONE Take 1-2 tablets (5-10 mg total) by mouth every 8 (eight) hours as needed for breakthrough pain (take between percocet doses for break through pain only).   oxyCODONE-acetaminophen 5-325 MG tablet Commonly known as:  PERCOCET/ROXICET Take 1-2 tablets by mouth every 8 (eight) hours as needed for moderate pain or severe pain.   Vitamin D3 5000 units Tabs Take 1 tablet (5,000 Units total) by mouth daily.     ASK your doctor about these medications   enoxaparin Kit Commonly known as:  LOVENOX 1 kit by Does not apply route once for 1 dose. Ask about: Should I take this medication?            Durable Medical Equipment  (From admission, onward)         Start     Ordered   09/13/17 1232  For home use only DME 3 n 1  Once     09/13/17 1231   09/13/17 1231  For home use only DME Walker rolling  Once    Question:  Patient needs a walker to treat with the following condition  Answer:  Femur fracture ([place])   09/13/17 1231         Activity: activity as tolerated per Orthopedics Diet: regular diet Wound Care: keep wound clean and dry  Follow-up with Dr. Scot Dock in 2 weeks. Follow up with Dr. Marcelino Scot in 2 weeks  Signed: Dagoberto Ligas 09/15/2017 9:06 AM

## 2017-09-15 NOTE — Telephone Encounter (Signed)
sch appt mld ltr 10/08/17 315pm p/o MD

## 2017-09-15 NOTE — Care Management Note (Signed)
Case Management Note Previous CM note completed by Vance Peper RN , CM   Patient Details  Name: Clinton Kelley MRN: [id number] Date of Birth: 06-04-1985  Subjective/Objective:   32 yr old gentleman s/p IM nailing of left femur fracture.                  Action/Plan: Case manager spoke with patient and wife concerning discharge plan. Case manager also spoke with [name] concerning patient's need for Home Health. Per [name] patient will do fine at discharge following exercises shown, no HH needed. Case manager explained this to patient. He will have family support at discharge.    Expected Discharge Date:  09/15/17              Expected Discharge Plan:  Home/Self Care  In-House Referral:  NA  Discharge planning Services  CM Consult  Post Acute Care Choice:  Durable Medical Equipment Choice offered to:  Spouse  DME Arranged:  3-N-1, Walker rolling DME Agency:  [facility]  HH Arranged:  NA HH Agency:  NA  Status of Service:  Completed, signed off  If discussed at Long Length of Stay Meetings, dates discussed:    Discharge Disposition: home/self care   Additional Comments:  09/15/17- 1015- Merrissa Giacobbe RN, CM- pt for transition home today, DME has been delivered to room- RW, 3n1. No further CM needs noted for transition home.   Zenda Alpers El Castillo, RN 09/15/2017, 10:15 AM [phone] 4E Transition Care Coordinator

## 2017-09-15 NOTE — Progress Notes (Signed)
Orthopedic Trauma Service Progress Note   Patient ID: Clinton Kelley MRN: [id number] DOB/AGE: [birth year] 30 y.o.  Subjective:  Pain in thigh his better Having increased L hip pain over the last day  Numbness medial leg improved and tolerable  No CP or SOB No nausea or vomiting  Overall pain in improved Mobilizing better   +flatus +void   ROS As above  Objective:   VITALS:   Vitals:   09/14/17 1243 09/14/17 2014 09/14/17 2348 09/15/17 0522  BP: (!) 155/90 (!) 146/74 (!) 143/89 139/84  Pulse: 97 95 88 87  Resp: (!) 22 (!) 22 15 18   Temp: 98.5 F (36.9 C) 99.3 F (37.4 C) 99 F (37.2 C) 99.2 F (37.3 C)  TempSrc: Oral Oral Oral Oral  SpO2: 99% 98% 95% 95%  Weight:      Height:        Estimated body mass index is 25.83 kg/m as calculated from the following:   Height as of this encounter: 5\' 10"  (1.778 m).   Weight as of this encounter: 81.6 kg.   Intake/Output      09/08 0701 - 09/09 0700 09/09 0701 - 09/10 0700   P.O. 1850    I.V. (mL/kg) 3827.2 (46.9)    Total Intake(mL/kg) 5677.2 (69.6)    Urine (mL/kg/hr) 2850 (1.5)    Drains 50    Total Output 2900    Net +2777.2           LABS  Results for Clinton Kelley (MRN [id number]) as of 09/15/2017 08:58  Ref. Range 09/13/2017 05:00 09/13/2017 05:48  Sodium Latest Ref Range: 135 - 145 mmol/L 137   Potassium Latest Ref Range: 3.5 - 5.1 mmol/L 3.7   Chloride Latest Ref Range: 98 - 111 mmol/L 105   CO2 Latest Ref Range: 22 - 32 mmol/L 24   Glucose Latest Ref Range: 70 - 99 mg/dL 621 (H)   Mean Plasma Glucose Latest Units: mg/dL 308.65   BUN Latest Ref Range: 6 - 20 mg/dL 9   Creatinine Latest Ref Range: 0.61 - 1.24 mg/dL 7.84   Calcium Latest Ref Range: 8.9 - 10.3 mg/dL 7.6 (L)   Anion gap Latest Ref Range: 5 - 15  8   Calcium, Ionized, Serum Latest Ref Range: 4.5 - 5.6 mg/dL 4.5   Phosphorus Latest Ref Range: 2.5 - 4.6 mg/dL 3.0   Magnesium Latest Ref Range: 1.7 - 2.4 mg/dL 2.0    Alkaline Phosphatase Latest Ref Range: 38 - 126 U/L 33 (L)   Albumin Latest Ref Range: 3.5 - 5.0 g/dL 3.2 (L)   AST Latest Ref Range: 15 - 41 U/L 38   ALT Latest Ref Range: 0 - 44 U/L 36   Total Protein Latest Ref Range: 6.5 - 8.1 g/dL 5.1 (L)   Total Bilirubin Latest Ref Range: 0.3 - 1.2 mg/dL 1.3 (H)   PREALBUMIN Latest Ref Range: 18 - 38 mg/dL 69.6   GFR, Est Non African American Latest Ref Range: >60 mL/min >60   GFR, Est African American Latest Ref Range: >60 mL/min >60   WBC Latest Ref Range: 4.0 - 10.5 K/uL 12.9 (H)   RBC Latest Ref Range: 4.22 - 5.81 MIL/uL 3.29 (L)   Hemoglobin Latest Ref Range: 13.0 - 17.0 g/dL 29.5 (L)   HCT Latest Ref Range: 39.0 - 52.0 % 30.0 (L)   MCV Latest Ref Range: 78.0 - 100.0 fL 91.2   MCH Latest Ref Range: 26.0 - 34.0 pg 31.0  MCHC Latest Ref Range: 30.0 - 36.0 g/dL 40.9   RDW Latest Ref Range: 11.5 - 15.5 % 12.5   Platelets Latest Ref Range: 150 - 400 K/uL 183   Hemoglobin A1C Latest Ref Range: 4.8 - 5.6 % 5.2   Testosterone Free Latest Ref Range: 8.7 - 25.1 pg/mL 1.9 (L)   PTH, Intact Latest Ref Range: 15 - 65 pg/mL 30   Calcium, Total (PTH) Latest Ref Range: 8.7 - 10.2 mg/dL 7.5 (L)   PTH Interp Unknown Comment   TSH Latest Ref Range: 0.350 - 4.500 uIU/mL 1.138   Amphetamines Latest Ref Range: NONE DETECTED   NONE DETECTED  Barbiturates Latest Ref Range: NONE DETECTED   NONE DETECTED  Benzodiazepines Latest Ref Range: NONE DETECTED   POSITIVE (A)  Opiates Latest Ref Range: NONE DETECTED   NONE DETECTED  COCAINE Latest Ref Range: NONE DETECTED   NONE DETECTED  Tetrahydrocannabinol Latest Ref Range: NONE DETECTED   NONE DETECTED    PHYSICAL EXAM:   Gen: awake and alert, sitting in bedside chair, appears better Lungs: breathing unlabored Cardiac: regular  Ext:       Left Lower Extremity   Knee incisions look great  No signs of infection   JP drain still in (this will be pulled later this am)  Swelling well controlled to L  leg  Numbness along saphenous nv distribution               DPN, SPN, TN sensation intact             EHL, FHL, AT, PT, peroneals, gastroc motor intact             + Quad set              + knee flexion with moderate effort              No pain with passive stretching of leg   Assessment/Plan: 3 Days Post-Op   Principal Problem:   Fall from tree Active Problems:   Rupture of artery (HCC)   Ischemia of extremity   Femur fracture, left (HCC)   Anti-infectives (From admission, onward)   Start     Dose/Rate Route Frequency Ordered Stop   09/12/17 2200  ceFAZolin (ANCEF) IVPB 2g/100 mL premix  Status:  Discontinued     2 g 200 mL/hr over 30 Minutes Intravenous Every 8 hours 09/12/17 2013 09/12/17 2039   09/12/17 2015  ceFAZolin (ANCEF) IVPB 2g/100 mL premix     2 g 200 mL/hr over 30 Minutes Intravenous Every 8 hours 09/12/17 2013 09/13/17 0446    .  POD/HD#: 65  32 y/o male s/p fall from tree    -comminuted L midshaft femur fracture with SFA injury s/p retrograde IMN and vascular repair             TDWB L leg x 6 weeks              Unrestricted ROM L knee and hip, pt with significant quad injury so it is important to be diligent with ROM exercises to prevent contracture    HEP   PT- please teach HEP for L knee ROM- AROM, PROM. Prone exercises as well. No ROM restrictions.  Quad sets, SLR, LAQ, SAQ, heel slides, stretching, prone flexion and extension     Ankle theraband program, heel cord stretching, toe towel curls, etc              PT/OT  Pt can use crutches or walker              Ice PRN             Dressing changes as needed                           Do not place pillows under knee when at rest. Place pillows under ankle to keep knee in full extension when at rest    Will order zero knee bone foam as well   Hip pain- suspect this is referred from his injury and surgical repair. We stressed his femoral neck intra-op under xray and did not  appreciate fracture. Will repeat hip xray, as long as xray negative pt can dc home today                 - Pain management:             As follows                         Tylenol 500 mg po q8h scheduled                         Percocet 5/325 1-2 po q8h prn pain (primary opioid modality)                         Oxy IR 5-10 mg po q4h prn breakthrough pain only---> give only between percocet doses if needed                         Morphine 2 mg IV q3h prn severe breakthrough pain---> give only after all po meds have been given                            Robaxin 750 mg po q8h scheduled    - ABL anemia/Hemodynamics             Stable   - Medical issues              Nicotine dependence                         Dicussed deleterious effects of nicotine on bone and wound healing                         He currently vapes and was wanting to quit                         States he will quit                         Can see how he is doing at his first appointment and if struggling could try chantix or wellbutrin (we have had better success with chantix with less SE's but pt has medicaid and this may not be covered)     - DVT/PE prophylaxis:             Lovenox                          lovenox 40 mg sq daily x 28 days  -  ID:              periop abx completed   - Metabolic Bone Disease:             Clinically pts bone quality was very poor intra-op             Check labs to evaluate for any correctable problems--->labs are pending, Free T is very low    Did not get to discuss with pt as other people in room    Will discuss at first follow up appointment                           vitamin d, calcium and vitamin c  - Activity:             TDWB L leg   - FEN/GI prophylaxis/Foley/Lines:             Reg diet    - Impediments to fracture healing:             Nicotine use             Associated vascular injury                          Suspect moderate periosteal stripping given magnitude of  injury              ?metabolic bone disease   - Dispo:             PT/OT             Follow up with ortho in 10-14 days                Mearl Latin, PA-C [facility] [phone] Traci Sermon (C) 09/15/2017, 8:55 AM

## 2017-09-15 NOTE — Progress Notes (Signed)
Occupational Therapy Treatment Patient Details Name: Clinton Kelley MRN: [id number] DOB: Jan 07, 1986 Today's Date: 09/15/2017    History of present illness Clinton Kelley is a 32 y.o. male who fell 30 feet out of a tree.  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  Also T9 compression fracture.  After Dr. Carola Frost fix his broken femur and Dr. Edilia Bo repaired the artery.  IM nail left femur and repair of left femoral artery rupture.  PMH: back surgery   OT comments  Pt progressing towards established OT goals. Pt requiring Min Guard A for safety during bed mobility and Max cues for sequencing for techniques to decreased pain. Providing education on LB dressing and pt requiring Min A for donning pants on LLE and Min Guard A for safety in standing. Educating pt and wife on safe shower and tub transfers with 3N1; both verbalized understanding. Answered all pt and family questions in preparation for dc later today. Will continue to follow acutely as admitted.    Follow Up Recommendations  No OT follow up;Supervision/Assistance - 24 hour    Equipment Recommendations  3 in 1 bedside commode    Recommendations for Other Services      Precautions / Restrictions Precautions Precautions: Fall Precaution Comments: JP drain left LE Restrictions Weight Bearing Restrictions: Yes LLE Weight Bearing: Touchdown weight bearing       Mobility Bed Mobility Overal bed mobility: Needs Assistance Bed Mobility: Sit to Supine     Supine to sit: Min guard     General bed mobility comments: Cues for sequencing to exit bed on left side. Min Guard A for safety  Transfers Overall transfer level: Needs assistance Equipment used: Rolling walker (2 wheeled) Transfers: Sit to/from Stand Sit to Stand: Min guard         General transfer comment: min guard for safety    Balance Overall balance assessment: Needs  assistance Sitting-balance support: No upper extremity supported;Feet supported Sitting balance-Leahy Scale: Fair Sitting balance - Comments: Min guard assist to sit EOB    Standing balance support: Bilateral upper extremity supported;During functional activity Standing balance-Leahy Scale: Poor Standing balance comment: heavy reliance on B UE support using RW                            ADL either performed or assessed with clinical judgement   ADL Overall ADL's : Needs assistance/impaired                     Lower Body Dressing: Minimal assistance;Sit to/from stand Lower Body Dressing Details (indicate cue type and reason): Decreased reached and limited by LLE pain. Pt requiring Min A to bring pants over LLE. Pt able to then stand and pull pants over hips.  Toilet Transfer: Min guard;Ambulation;BSC;RW;Regular Teacher, adult education Details (indicate cue type and reason): reviewed safety, techniques and 3:1 commode       Tub/Shower Transfer Details (indicate cue type and reason): Educating pt and wife on safe tub transfers with 3N1. PT reporting he may have access to walk in shower. Educating pt on shower transfer. Providing handout for technique. Reviewed TDWBing status during transfer. Pt in to much pain to practice.  Functional mobility during ADLs: Min guard;Rolling walker General ADL Comments: Providing education tub transfers and LB dressing. Pt and wife verablized and demonstrated understanding. Min Guard A  for safety in standing.      Vision   Vision Assessment?: No apparent visual deficits   Perception     Praxis      Cognition Arousal/Alertness: Awake/alert Behavior During Therapy: WFL for tasks assessed/performed Overall Cognitive Status: Within Functional Limits for tasks assessed                                          Exercises     Shoulder Instructions       General Comments Wife present throughout. Montez Morita, PA,  arriving and reviewing ROM and WBing. Reeducating pt on knee extension during rest    Pertinent Vitals/ Pain       Pain Assessment: Faces Faces Pain Scale: Hurts even more Pain Location: L LE  Pain Descriptors / Indicators: Aching;Grimacing;Guarding;Operative site guarding Pain Intervention(s): Monitored during session;Limited activity within patient's tolerance;Repositioned  Home Living                                          Prior Functioning/Environment              Frequency  Min 2X/week        Progress Toward Goals  OT Goals(current goals can now be found in the care plan section)  Progress towards OT goals: Progressing toward goals  Acute Rehab OT Goals Patient Stated Goal: to go home and get around on my own OT Goal Formulation: With patient Time For Goal Achievement: 09/28/17 Potential to Achieve Goals: Good ADL Goals Pt Will Perform Lower Body Dressing: with min assist;sit to/from stand;with adaptive equipment Pt Will Transfer to Toilet: with supervision;ambulating;bedside commode;regular height toilet Pt Will Perform Toileting - Clothing Manipulation and hygiene: with supervision;sitting/lateral leans;sit to/from stand Pt Will Perform Tub/Shower Transfer: Tub transfer;3 in 1;rolling walker;with min assist  Plan Discharge plan remains appropriate    Co-evaluation                 AM-PAC PT "6 Clicks" Daily Activity     Outcome Measure   Help from another person eating meals?: None Help from another person taking care of personal grooming?: None(seated) Help from another person toileting, which includes using toliet, bedpan, or urinal?: A Little Help from another person bathing (including washing, rinsing, drying)?: A Little Help from another person to put on and taking off regular upper body clothing?: None Help from another person to put on and taking off regular lower body clothing?: A Little 6 Click Score: 21    End of  Session Equipment Utilized During Treatment: Rolling walker  OT Visit Diagnosis: Other abnormalities of gait and mobility (R26.89);Pain Pain - Right/Left: Left Pain - part of body: Hip;Knee;Leg   Activity Tolerance Patient tolerated treatment well;Patient limited by pain   Patient Left in chair;with call bell/phone within reach;with family/visitor present   Nurse Communication Mobility status;Weight bearing status        Time: 5638-7564 OT Time Calculation (min): 33 min  Charges: OT General Charges $OT Visit: 1 Visit OT Treatments $Self Care/Home Management : 23-37 mins  Berlie Hatchel MSOT, OTR/L Acute Rehab Pager: [phone] Office: [phone]   Theodoro Grist Maryah Marinaro 09/15/2017, 9:08 AM

## 2017-09-15 NOTE — Progress Notes (Signed)
Physical Therapy Treatment Patient Details Name: Clinton Kelley MRN: [id number] DOB: [birth year] Today's Date: 09/15/2017    History of Present Illness Clinton Kelley is a 32 y.o. male who fell 30 feet out of a tree.  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  Also T9 compression fracture.  After Dr. Marcelino Scot fix his broken femur and Dr. Scot Dock repaired the artery.  IM nail left femur and repair of left femoral artery rupture.  PMH: back surgery    PT Comments    Pt doing well with mobility with walker. Reviewed the use of the bone foam for lt knee extension. Pt has written exercise program for home. Wife is very supportive. Discussed multiple options for stairs into house. Pt likely will be able to hop up backwards into house with walker. Wife present for all treatment and also verbalizes understanding.   Follow Up Recommendations  No PT follow up;Supervision - Intermittent(Pt to perform home program with assistance of wife)     Equipment Recommendations  Rolling walker with 5" wheels;3in1 (PT)    Recommendations for Other Services       Precautions / Restrictions Precautions Precautions: None Restrictions Weight Bearing Restrictions: Yes LLE Weight Bearing: Touchdown weight bearing    Mobility  Bed Mobility Overal bed mobility: Needs Assistance Bed Mobility: Supine to Sit     Supine to sit: Min assist     General bed mobility comments: Wife assist with moving of LLE  Transfers Overall transfer level: Needs assistance Equipment used: Rolling walker (2 wheeled) Transfers: Sit to/from Stand Sit to Stand: Supervision            Ambulation/Gait Ambulation/Gait assistance: Supervision Gait Distance (Feet): 100 Feet Assistive device: Rolling walker (2 wheeled) Gait Pattern/deviations: Step-to pattern(hop to ) Gait velocity: decr Gait velocity interpretation: <1.31 ft/sec,  indicative of household ambulator General Gait Details: Pt with steady gait with walker and able to maintain TDWB   Stairs Stairs: Yes Stairs assistance: Min guard Stair Management: Backwards;Forwards;With walker Number of Stairs: 1(x 3) General stair comments: Pt performed going up 1 stair forwards with walker and backwards by walker. Came down step forwards with walker.   Wheelchair Mobility    Modified Rankin (Stroke Patients Only)       Balance Overall balance assessment: Needs assistance Sitting-balance support: No upper extremity supported;Feet supported Sitting balance-Leahy Scale: Fair     Standing balance support: During functional activity;Single extremity supported Standing balance-Leahy Scale: Poor Standing balance comment: Needs UE support at sink due to weight bearing limitations of LLE                            Cognition Arousal/Alertness: Awake/alert Behavior During Therapy: WFL for tasks assessed/performed Overall Cognitive Status: Within Functional Limits for tasks assessed                                        Exercises      General Comments        Pertinent Vitals/Pain Pain Assessment: Faces Faces Pain Scale: Hurts even more Pain Location: LLE with movement Pain Descriptors / Indicators: Aching;Grimacing;Guarding;Operative site guarding Pain Intervention(s): Premedicated before session;Limited activity within patient's tolerance;Repositioned    Home Living  Prior Function            PT Goals (current goals can now be found in the care plan section) Progress towards PT goals: Goals met/education completed, patient discharged from PT    Frequency    Min 6X/week      PT Plan Discharge plan needs to be updated    Co-evaluation              AM-PAC PT "6 Clicks" Daily Activity  Outcome Measure  Difficulty turning over in bed (including adjusting bedclothes, sheets  and blankets)?: Unable Difficulty moving from lying on back to sitting on the side of the bed? : Unable Difficulty sitting down on and standing up from a chair with arms (e.g., wheelchair, bedside commode, etc,.)?: Unable Help needed moving to and from a bed to chair (including a wheelchair)?: A Little Help needed walking in hospital room?: A Little Help needed climbing 3-5 steps with a railing? : A Little 6 Click Score: 12    End of Session   Activity Tolerance: Patient tolerated treatment well Patient left: with call bell/phone within reach;with family/visitor present;in chair   PT Visit Diagnosis: Unsteadiness on feet (R26.81);Muscle weakness (generalized) (M62.81);Pain;Difficulty in walking, not elsewhere classified (R26.2) Pain - Right/Left: Left Pain - part of body: Leg;Hip;Knee     Time: 1147-1208 PT Time Calculation (min) (ACUTE ONLY): 21 min  Charges:  $Gait Training: 8-22 mins                     [place] Pager [phone] Office [place] 09/15/2017, 12:21 PM

## 2017-09-15 NOTE — Progress Notes (Signed)
   VASCULAR SURGERY ASSESSMENT & PLAN:   3 Days Post-Op s/p: Repair of superficial femoral artery with interposition saphenous vein graft.  His pain is under good control and he has been ambulating.  I think he can probably go home today.  He will need follow-up with myself in 2 to 3 weeks and also with orthopedics.  SUBJECTIVE:   Pain under good control.  He is not requiring intravenous pain medicine.  PHYSICAL EXAM:   Vitals:   09/14/17 1243 09/14/17 2014 09/14/17 2348 09/15/17 0522  BP: (!) 155/90 (!) 146/74 (!) 143/89 139/84  Pulse: 97 95 88 87  Resp: (!) 22 (!) 22 15 18   Temp: 98.5 F (36.9 C) 99.3 F (37.4 C) 99 F (37.2 C) 99.2 F (37.3 C)  TempSrc: Oral Oral Oral Oral  SpO2: 99% 98% 95% 95%  Weight:      Height:       Palpable left dorsalis pedis and posterior tibial pulse. Mild left leg swelling.  LABS:   Lab Results  Component Value Date   WBC 10.1 09/14/2017   HGB 9.0 (L) 09/14/2017   HCT 27.5 (L) 09/14/2017   MCV 92.6 09/14/2017   PLT 148 (L) 09/14/2017   Lab Results  Component Value Date   CREATININE 0.90 09/13/2017   Lab Results  Component Value Date   INR 0.99 09/12/2017   CBG (last 3)  No results for input(s): GLUCAP in the last 72 hours.  PROBLEM LIST:    Principal Problem:   Fall from tree Active Problems:   Rupture of artery (HCC)   Ischemia of extremity   Femur fracture, left (HCC)   CURRENT MEDS:   . acetaminophen  500 mg Oral Q8H  . amLODipine  2.5 mg Oral Daily  . calcium citrate  200 mg of elemental calcium Oral BID  . cholecalciferol  2,000 Units Oral BID  . docusate sodium  100 mg Oral Daily  . enoxaparin (LOVENOX) injection  40 mg Subcutaneous Q24H  . methocarbamol  750 mg Oral TID  . pantoprazole  40 mg Oral Daily  . vitamin C  500 mg Oral Daily    Waverly Ferrari Beeper: [phone] Office: [phone] 09/15/2017

## 2017-09-16 LAB — TYPE AND SCREEN
ABO/RH(D): O POS
ANTIBODY SCREEN: NEGATIVE
UNIT DIVISION: 0
Unit division: 0

## 2017-09-16 LAB — BPAM RBC
Blood Product Expiration Date: 201910052359
Blood Product Expiration Date: 201910052359
ISSUE DATE / TIME: 201909061348
ISSUE DATE / TIME: 201909061348
UNIT TYPE AND RH: 5100
Unit Type and Rh: 5100

## 2017-09-18 NOTE — Progress Notes (Signed)
AVS put in envelope given to Pt with all of his belongings. Education for self care after discharged given with teach back, Pt, his wife and his sister who is an Charity fundraiserN had fully understanding , no further questions. Pt left unit at 13:30 by volunteer and wheelchair.   Filiberto Pinksnpaneeya Milta Croson, RN

## 2017-09-19 LAB — TESTOSTERONE, % FREE: Testosterone-% Free: 2.2 % — ABNORMAL HIGH (ref 0.2–0.7)

## 2017-10-08 ENCOUNTER — Ambulatory Visit (INDEPENDENT_AMBULATORY_CARE_PROVIDER_SITE_OTHER): Payer: Self-pay | Admitting: Vascular Surgery

## 2017-10-08 ENCOUNTER — Encounter: Payer: Self-pay | Admitting: Vascular Surgery

## 2017-10-08 VITALS — BP 132/77 | HR 84 | Temp 97.8°F | Resp 16 | Ht 70.0 in | Wt 180.0 lb

## 2017-10-08 DIAGNOSIS — Z48812 Encounter for surgical aftercare following surgery on the circulatory system: Secondary | ICD-10-CM
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
## 2018-04-08 ENCOUNTER — Ambulatory Visit: Payer: Medicaid Other | Admitting: Vascular Surgery

## 2018-04-08 ENCOUNTER — Other Ambulatory Visit (HOSPITAL_COMMUNITY): Payer: Medicaid Other

## 2018-04-08 ENCOUNTER — Encounter (HOSPITAL_COMMUNITY): Payer: Medicaid Other

## 2018-04-14 DIAGNOSIS — R7989 Other specified abnormal findings of blood chemistry: Secondary | ICD-10-CM | POA: Diagnosis not present

## 2018-04-14 DIAGNOSIS — I10 Essential (primary) hypertension: Secondary | ICD-10-CM | POA: Diagnosis not present

## 2018-07-31 ENCOUNTER — Other Ambulatory Visit: Payer: Self-pay | Admitting: Vascular Surgery

## 2018-07-31 DIAGNOSIS — Z48812 Encounter for surgical aftercare following surgery on the circulatory system: Secondary | ICD-10-CM

## 2018-08-11 ENCOUNTER — Telehealth (HOSPITAL_COMMUNITY): Payer: Self-pay

## 2018-08-11 NOTE — Telephone Encounter (Signed)

## 2018-08-12 ENCOUNTER — Other Ambulatory Visit: Payer: Self-pay

## 2018-08-12 ENCOUNTER — Ambulatory Visit (HOSPITAL_COMMUNITY)
Admission: RE | Admit: 2018-08-12 | Discharge: 2018-08-12 | Disposition: A | Discharge status: Home / Self Care | Payer: Medicaid Other | Source: Ambulatory Visit | Attending: Family | Admitting: Family

## 2018-08-12 ENCOUNTER — Ambulatory Visit (INDEPENDENT_AMBULATORY_CARE_PROVIDER_SITE_OTHER)
Admission: RE | Admit: 2018-08-12 | Discharge: 2018-08-12 | Disposition: A | Discharge status: Home / Self Care | Payer: Medicaid Other | Source: Ambulatory Visit | Attending: Vascular Surgery | Admitting: Vascular Surgery

## 2018-08-12 ENCOUNTER — Ambulatory Visit (INDEPENDENT_AMBULATORY_CARE_PROVIDER_SITE_OTHER): Payer: Medicaid Other | Admitting: Vascular Surgery

## 2018-08-12 ENCOUNTER — Encounter: Payer: Self-pay | Admitting: Vascular Surgery

## 2018-08-12 VITALS — BP 126/81 | HR 70 | Temp 98.0°F | Resp 20 | Ht 70.0 in | Wt 177.0 lb

## 2018-08-12 DIAGNOSIS — Z48812 Encounter for surgical aftercare following surgery on the circulatory system: Secondary | ICD-10-CM
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
## 2018-10-19 DIAGNOSIS — T1501XA Foreign body in cornea, right eye, initial encounter: Secondary | ICD-10-CM | POA: Diagnosis not present
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
## 2020-01-06 DIAGNOSIS — I10 Essential (primary) hypertension: Secondary | ICD-10-CM | POA: Diagnosis not present

## 2020-03-03 DIAGNOSIS — Z8249 Family history of ischemic heart disease and other diseases of the circulatory system: Secondary | ICD-10-CM | POA: Diagnosis not present

## 2020-03-03 DIAGNOSIS — I10 Essential (primary) hypertension: Secondary | ICD-10-CM | POA: Diagnosis not present

## 2020-03-03 DIAGNOSIS — I119 Hypertensive heart disease without heart failure: Secondary | ICD-10-CM | POA: Diagnosis not present

## 2020-03-03 DIAGNOSIS — I517 Cardiomegaly: Secondary | ICD-10-CM | POA: Diagnosis not present

## 2020-03-14 DIAGNOSIS — I10 Essential (primary) hypertension: Secondary | ICD-10-CM | POA: Diagnosis not present

## 2020-03-14 DIAGNOSIS — M940 Chondrocostal junction syndrome [Tietze]: Secondary | ICD-10-CM | POA: Diagnosis not present

## 2020-12-05 DIAGNOSIS — R002 Palpitations: Secondary | ICD-10-CM | POA: Diagnosis not present

## 2020-12-05 DIAGNOSIS — I10 Essential (primary) hypertension: Secondary | ICD-10-CM | POA: Diagnosis not present

## 2021-01-31 DIAGNOSIS — R002 Palpitations: Secondary | ICD-10-CM | POA: Diagnosis not present

## 2021-04-03 DIAGNOSIS — M940 Chondrocostal junction syndrome [Tietze]: Secondary | ICD-10-CM | POA: Diagnosis not present

## 2021-04-03 DIAGNOSIS — I10 Essential (primary) hypertension: Secondary | ICD-10-CM | POA: Diagnosis not present

## 2022-01-02 ENCOUNTER — Other Ambulatory Visit: Payer: Self-pay | Admitting: *Deleted

## 2022-01-02 DIAGNOSIS — I998 Other disorder of circulatory system: Secondary | ICD-10-CM

## 2022-01-17 ENCOUNTER — Ambulatory Visit (HOSPITAL_COMMUNITY): Payer: Medicaid Other

## 2022-01-17 ENCOUNTER — Ambulatory Visit: Payer: Medicaid Other | Admitting: Vascular Surgery

## 2022-01-24 ENCOUNTER — Ambulatory Visit: Payer: Medicaid Other | Admitting: Vascular Surgery

## 2022-01-24 ENCOUNTER — Ambulatory Visit: Payer: Medicaid Other

## 2022-02-07 ENCOUNTER — Ambulatory Visit: Payer: Medicaid Other | Admitting: Vascular Surgery

## 2022-02-07 ENCOUNTER — Ambulatory Visit: Payer: Medicaid Other | Attending: Cardiology
